# Patient Record
Sex: Male | Born: 1950 | Race: Black or African American | Hispanic: No | Marital: Single | Smoking: Current every day smoker
Health system: Southern US, Community
[De-identification: ages and names within clinical notes are randomized; demographics above are authoritative.]

## PROBLEM LIST (undated history)

## (undated) DIAGNOSIS — Z5900 Homelessness unspecified: Secondary | ICD-10-CM

## (undated) DIAGNOSIS — Z915 Personal history of self-harm: Secondary | ICD-10-CM

## (undated) DIAGNOSIS — Z9151 Personal history of suicidal behavior: Secondary | ICD-10-CM

## (undated) DIAGNOSIS — J449 Chronic obstructive pulmonary disease, unspecified: Secondary | ICD-10-CM

## (undated) DIAGNOSIS — I10 Essential (primary) hypertension: Secondary | ICD-10-CM

---

## 2014-04-09 ENCOUNTER — Other Ambulatory Visit (HOSPITAL_COMMUNITY): Payer: Self-pay | Admitting: Internal Medicine

## 2014-04-09 ENCOUNTER — Ambulatory Visit (HOSPITAL_COMMUNITY)
Admission: RE | Admit: 2014-04-09 | Discharge: 2014-04-09 | Disposition: A | Discharge status: Home / Self Care | Payer: Medicare Other | Source: Ambulatory Visit | Attending: Internal Medicine | Admitting: Internal Medicine

## 2014-04-09 DIAGNOSIS — R0602 Shortness of breath: Secondary | ICD-10-CM | POA: Insufficient documentation

## 2014-04-09 DIAGNOSIS — R0989 Other specified symptoms and signs involving the circulatory and respiratory systems: Secondary | ICD-10-CM | POA: Diagnosis not present

## 2015-03-01 ENCOUNTER — Encounter (HOSPITAL_COMMUNITY): Payer: Self-pay | Admitting: Family Medicine

## 2015-03-01 ENCOUNTER — Emergency Department (HOSPITAL_COMMUNITY)
Admission: EM | Admit: 2015-03-01 | Discharge: 2015-03-02 | Disposition: A | Discharge status: Other Acute Inpatient Hospital | Payer: Medicare Other | Attending: Emergency Medicine | Admitting: Emergency Medicine

## 2015-03-01 DIAGNOSIS — I10 Essential (primary) hypertension: Secondary | ICD-10-CM | POA: Insufficient documentation

## 2015-03-01 DIAGNOSIS — R443 Hallucinations, unspecified: Secondary | ICD-10-CM | POA: Insufficient documentation

## 2015-03-01 DIAGNOSIS — J029 Acute pharyngitis, unspecified: Secondary | ICD-10-CM | POA: Insufficient documentation

## 2015-03-01 DIAGNOSIS — Z720 Tobacco use: Secondary | ICD-10-CM | POA: Diagnosis not present

## 2015-03-01 DIAGNOSIS — R45851 Suicidal ideations: Secondary | ICD-10-CM | POA: Insufficient documentation

## 2015-03-01 DIAGNOSIS — F10239 Alcohol dependence with withdrawal, unspecified: Secondary | ICD-10-CM | POA: Diagnosis present

## 2015-03-01 HISTORY — DX: Essential (primary) hypertension: I10

## 2015-03-01 LAB — CBC
HEMATOCRIT: 39.8 % (ref 39.0–52.0)
Hemoglobin: 13.3 g/dL (ref 13.0–17.0)
MCH: 32.2 pg (ref 26.0–34.0)
MCHC: 33.4 g/dL (ref 30.0–36.0)
MCV: 96.4 fL (ref 78.0–100.0)
PLATELETS: 206 10*3/uL (ref 150–400)
RBC: 4.13 MIL/uL — ABNORMAL LOW (ref 4.22–5.81)
RDW: 13.8 % (ref 11.5–15.5)
WBC: 5.7 10*3/uL (ref 4.0–10.5)

## 2015-03-01 LAB — COMPREHENSIVE METABOLIC PANEL
ALBUMIN: 3.9 g/dL (ref 3.5–5.0)
ALK PHOS: 49 U/L (ref 38–126)
ALT: 16 U/L — AB (ref 17–63)
AST: 22 U/L (ref 15–41)
Anion gap: 7 (ref 5–15)
BILIRUBIN TOTAL: 0.3 mg/dL (ref 0.3–1.2)
BUN: 10 mg/dL (ref 6–20)
CO2: 25 mmol/L (ref 22–32)
CREATININE: 1.01 mg/dL (ref 0.61–1.24)
Calcium: 9.1 mg/dL (ref 8.9–10.3)
Chloride: 104 mmol/L (ref 101–111)
GFR calc Af Amer: >60 mL/min (ref >60)
GLUCOSE: 86 mg/dL (ref 65–99)
Potassium: 4.1 mmol/L (ref 3.5–5.1)
Sodium: 136 mmol/L (ref 135–145)
TOTAL PROTEIN: 7.1 g/dL (ref 6.5–8.1)

## 2015-03-01 LAB — RAPID URINE DRUG SCREEN, HOSP PERFORMED
AMPHETAMINES: NOT DETECTED
BARBITURATES: NOT DETECTED
BENZODIAZEPINES: NOT DETECTED
Cocaine: NOT DETECTED
Opiates: NOT DETECTED
Tetrahydrocannabinol: NOT DETECTED

## 2015-03-01 LAB — SALICYLATE LEVEL: Salicylate Lvl: <4 mg/dL (ref 2.8–30.0)

## 2015-03-01 LAB — ETHANOL

## 2015-03-01 LAB — ACETAMINOPHEN LEVEL: Acetaminophen (Tylenol), Serum: <10 ug/mL — ABNORMAL LOW (ref 10–30)

## 2015-03-01 MED ORDER — ALUM & MAG HYDROXIDE-SIMETH 200-200-20 MG/5ML PO SUSP: 30.0000 mL | ORAL | Status: DC | PRN

## 2015-03-01 MED ORDER — IBUPROFEN 400 MG PO TABS: 600.0000 mg | ORAL_TABLET | Freq: Three times a day (TID) | ORAL | Status: DC | PRN

## 2015-03-01 MED ORDER — THIAMINE HCL 100 MG/ML IJ SOLN: 100.0000 mg | Freq: Every day | INTRAMUSCULAR | Status: DC

## 2015-03-01 MED ORDER — ONDANSETRON HCL 4 MG PO TABS: 4.0000 mg | ORAL_TABLET | Freq: Three times a day (TID) | ORAL | Status: DC | PRN

## 2015-03-01 MED ORDER — LORAZEPAM 1 MG PO TABS: 1.0000 mg | ORAL_TABLET | Freq: Three times a day (TID) | ORAL | Status: DC | PRN

## 2015-03-01 MED ORDER — VITAMIN B-1 100 MG PO TABS
100.0000 mg | ORAL_TABLET | Freq: Every day | ORAL | Status: DC
  Administered 2015-03-01: 100 mg via ORAL
  Filled 2015-03-01: qty 1

## 2015-03-01 MED ORDER — ACETAMINOPHEN 325 MG PO TABS: 650.0000 mg | ORAL_TABLET | ORAL | Status: DC | PRN
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
## 2015-03-01 NOTE — ED Provider Notes (Signed)
CSN: [id number]     Arrival date & time 03/01/15  1521 History   First MD Initiated Contact with Patient 03/01/15 1645     Chief Complaint  Patient presents with  . Alcohol Problem  . Drug Problem     (Consider location/radiation/quality/duration/timing/severity/associated sxs/prior Treatment) HPI   Ryan Reid is a 64 year old male, current smoker with history of hypertension, alcohol abuse and polysubstance abuse, who presents to the ER today requesting detox.  He currently is suicidal and homicidal and hearing voices.  His plan for suicide is to "jump off of that Tower where I'm staying."  He plans to shoot the "drug guy," although he denies access to a gun, he does state that he has access to knives.  He hears voices frequently that tell him to walk in front of cars and jump off the tower.  He regularly drinks about 6 beers a day, he last drank a 40 this morning, "because it was there."  He last smoked crack this morning as well.  He complains of mild discomfort in his throat.  He denies headache, chest pain, shortness of breath, wheeze, cough, fever, abdominal pain, nausea, vomiting, diarrhea, palpitations, lower extremity edema.   Past Medical History  Diagnosis Date  . Hypertension    History reviewed. No pertinent past surgical history. History reviewed. No pertinent family history. Social History  Substance Use Topics  . Smoking status: Current Every Day Smoker  . Smokeless tobacco: None  . Alcohol Use: Yes     Comment: 1 beer a day    Review of Systems  Constitutional: Negative.   HENT: Positive for sore throat. Negative for congestion, ear discharge, ear pain, facial swelling, hearing loss, mouth sores, nosebleeds, postnasal drip, rhinorrhea, sinus pressure, trouble swallowing and voice change.   Eyes: Negative.   Respiratory: Negative.   Cardiovascular: Negative.   Gastrointestinal: Negative.   Genitourinary: Negative.   Musculoskeletal: Negative.   Skin:  Negative.   Neurological: Negative.   Psychiatric/Behavioral: Positive for suicidal ideas and hallucinations. Negative for self-injury.   Allergies  Lactose intolerance (gi)  Home Medications   Prior to Admission medications   Not on File   BP 130/73 mmHg  Pulse 61  Temp(Src) 98.3 F (36.8 C)  Resp 18  Wt 147 lb 9 oz (66.934 kg)  SpO2 100% Physical Exam  Constitutional: He is oriented to person, place, and time. He appears well-developed and well-nourished. No distress.  Well-appearing thin male, appears older than stated age  HENT:  Head: Normocephalic and atraumatic.  Nose: Nose normal.  Mouth/Throat: Oropharynx is clear and moist. No oropharyngeal exudate.  Soft palate, uvula and posterior oropharynx - some hyperpigmented small lesions, without edema, erythema, or exudate  Eyes: Conjunctivae and EOM are normal. Pupils are equal, round, and reactive to light. Right eye exhibits no discharge. Left eye exhibits no discharge. No scleral icterus.  Neck: Normal range of motion. No JVD present. No tracheal deviation present. No thyromegaly present.  Cardiovascular: Normal rate, regular rhythm, normal heart sounds and intact distal pulses.  Exam reveals no gallop and no friction rub.   No murmur heard. Pulmonary/Chest: Effort normal and breath sounds normal. No respiratory distress. He has no wheezes. He has no rales. He exhibits no tenderness.  Abdominal: Soft. Bowel sounds are normal. He exhibits no distension and no mass. There is no tenderness. There is no rebound and no guarding.  Musculoskeletal: Normal range of motion. He exhibits no edema or tenderness.  Lymphadenopathy:  He has no cervical adenopathy.  Neurological: He is alert and oriented to person, place, and time. He has normal reflexes. No cranial nerve deficit. He exhibits normal muscle tone. Coordination normal.  Skin: Skin is warm and dry. No rash noted. He is not diaphoretic. No erythema. No pallor.  Psychiatric:  He has a normal mood and affect. His behavior is normal. Judgment and thought content normal.  Nursing note and vitals reviewed.   ED Course  Procedures (including critical care time) Labs Review Labs Reviewed  COMPREHENSIVE METABOLIC PANEL - Abnormal; Notable for the following:    ALT 16 (*)    All other components within normal limits  ACETAMINOPHEN LEVEL - Abnormal; Notable for the following:    Acetaminophen (Tylenol), Serum <10 (*)    All other components within normal limits  CBC - Abnormal; Notable for the following:    RBC 4.13 (*)    All other components within normal limits  ETHANOL  SALICYLATE LEVEL  URINE RAPID DRUG SCREEN, HOSP PERFORMED    Imaging Review No results found. I, Danelle Berry, personally reviewed and evaluated these images and lab results as part of my medical decision-making.   EKG Interpretation None      MDM   Final diagnoses:  None    Pt with ETOH abuse and crack use, requesting detox.  +SI and +HI with a plan and +auditory hallucinations, -visual hallucinations  Med clearance work up initiated, pt has no other complaints Urine drug screen negative, CMP, CBC unremarkable, negative salicylate, acetaminophen and ETOH  TTS consult requested With medical clearance, pt transferred to Pod C, with ED Psych hold orders  Beryle Flock - advised that pt does meet Inpt psych admission once we obtain 2 additional sets of normal vitals and CIWA done.  Orders added.  Pt will be admitted to [facility], PA-C 03/01/15 8295  Arby Barrette, MD 03/03/15 [phone]

## 2015-03-01 NOTE — ED Notes (Signed)
Pt here for treatment to detox from alcohol and crack/cocaine. sts last drink this am and last drug use the first of the month.

## 2015-03-01 NOTE — ED Notes (Signed)
Pt states homeless for 15 days. If able to get transportation to [place] would have resources.

## 2015-03-01 NOTE — ED Notes (Signed)
Pt eating and drinking without any problems

## 2015-03-01 NOTE — ED Notes (Signed)
Pt belongings inventoried, see inventory sheet. Placed in locker and with security.

## 2015-03-01 NOTE — ED Notes (Signed)
Pt eating meal tray 

## 2015-03-01 NOTE — ED Notes (Signed)
telepsych at bedside.

## 2015-03-01 NOTE — ED Notes (Signed)
Meal given

## 2015-03-02 ENCOUNTER — Encounter (HOSPITAL_COMMUNITY): Payer: Self-pay

## 2015-03-02 ENCOUNTER — Inpatient Hospital Stay (HOSPITAL_COMMUNITY)
Admission: EM | Admit: 2015-03-02 | Discharge: 2015-03-09 | DRG: 897 | Disposition: A | Discharge status: Rehabilitation Facility | Payer: Medicare Other | Source: Intra-hospital | Attending: Psychiatry | Admitting: Psychiatry

## 2015-03-02 DIAGNOSIS — F419 Anxiety disorder, unspecified: Secondary | ICD-10-CM | POA: Diagnosis present

## 2015-03-02 DIAGNOSIS — F1424 Cocaine dependence with cocaine-induced mood disorder: Secondary | ICD-10-CM

## 2015-03-02 DIAGNOSIS — I10 Essential (primary) hypertension: Secondary | ICD-10-CM | POA: Diagnosis present

## 2015-03-02 DIAGNOSIS — R45851 Suicidal ideations: Secondary | ICD-10-CM | POA: Diagnosis present

## 2015-03-02 DIAGNOSIS — F1994 Other psychoactive substance use, unspecified with psychoactive substance-induced mood disorder: Secondary | ICD-10-CM | POA: Diagnosis present

## 2015-03-02 DIAGNOSIS — F10230 Alcohol dependence with withdrawal, uncomplicated: Secondary | ICD-10-CM

## 2015-03-02 DIAGNOSIS — F1020 Alcohol dependence, uncomplicated: Secondary | ICD-10-CM | POA: Diagnosis present

## 2015-03-02 DIAGNOSIS — F17200 Nicotine dependence, unspecified, uncomplicated: Secondary | ICD-10-CM | POA: Diagnosis present

## 2015-03-02 DIAGNOSIS — R45850 Homicidal ideations: Secondary | ICD-10-CM | POA: Diagnosis present

## 2015-03-02 DIAGNOSIS — F1024 Alcohol dependence with alcohol-induced mood disorder: Secondary | ICD-10-CM | POA: Diagnosis present

## 2015-03-02 DIAGNOSIS — G4700 Insomnia, unspecified: Secondary | ICD-10-CM | POA: Diagnosis present

## 2015-03-02 DIAGNOSIS — F1420 Cocaine dependence, uncomplicated: Secondary | ICD-10-CM | POA: Diagnosis present

## 2015-03-02 MED ORDER — ACETAMINOPHEN 325 MG PO TABS: 650.0000 mg | ORAL_TABLET | Freq: Four times a day (QID) | ORAL | Status: DC | PRN

## 2015-03-02 MED ORDER — MAGNESIUM HYDROXIDE 400 MG/5ML PO SUSP
30.0000 mL | Freq: Every day | ORAL | Status: DC | PRN
Start: 2015-03-02 — End: 2015-03-09

## 2015-03-02 MED ORDER — TRAZODONE HCL 50 MG PO TABS
50.0000 mg | ORAL_TABLET | Freq: Every evening | ORAL | Status: DC | PRN
  Administered 2015-03-03 – 2015-03-05 (×3): 50 mg via ORAL
  Filled 2015-03-02: qty 28
  Filled 2015-03-02: qty 1
  Filled 2015-03-02 (×4): qty 28
  Filled 2015-03-02 (×2): qty 1
  Filled 2015-03-02: qty 28
  Filled 2015-03-02: qty 1
  Filled 2015-03-02 (×2): qty 28

## 2015-03-02 MED ORDER — HYDROXYZINE HCL 25 MG PO TABS
25.0000 mg | ORAL_TABLET | Freq: Four times a day (QID) | ORAL | Status: DC | PRN
Start: 2015-03-02 — End: 2015-03-09
  Administered 2015-03-03 – 2015-03-04 (×2): 25 mg via ORAL
  Filled 2015-03-02 (×2): qty 42
  Filled 2015-03-02: qty 1
  Filled 2015-03-02: qty 42
  Filled 2015-03-02 (×2): qty 1

## 2015-03-02 MED ORDER — ALUM & MAG HYDROXIDE-SIMETH 200-200-20 MG/5ML PO SUSP: 30.0000 mL | ORAL | Status: DC | PRN

## 2015-03-02 MED ORDER — ENSURE ENLIVE PO LIQD
237.0000 mL | Freq: Two times a day (BID) | ORAL | Status: DC
  Administered 2015-03-02 – 2015-03-08 (×10): 237 mL via ORAL

## 2015-03-02 NOTE — Progress Notes (Signed)
Admission Note  A 64 y/o patient with flat affect came in complaining of severe depression, alcohol and cocaine use. He states, "I drink and use cocaine as much as I can get my hands on." Pt also c/o low self-esteem, isolation, lack of motivation for activities, irritability, insomnia and homelessness. Pt who is also unsteady on his feet verbalizes h/o stroke causing bilateral leg weakness. Pt denied having any home meds. Pt goals according to patient are working on his overall psychological problem and staying alive.  Pt continues to be pleasant and cooperative during assessment.   Support, encouragement, and safe environment provided.  15-minute safety checks initiated and continued.

## 2015-03-02 NOTE — Progress Notes (Signed)
NUTRITION ASSESSMENT  Pt identified as at risk on the Malnutrition Screen Tool  INTERVENTION: 1. Educated patient on the importance of nutrition and encouraged intake of food and beverages. 2. Discussed weight goals. 3. Supplements: will order Ensure Enlive BID, each supplement provides 350 kcal and 20 grams of protein  NUTRITION DIAGNOSIS: Unintentional weight loss related to sub-optimal intake as evidenced by pt report.   Goal: Pt to meet >/= 90% of their estimated nutrition needs.  Monitor:  PO intake  Assessment:  Pt seen for MST. Per notes, pt with severe depression. He uses alcohol and cocaine and has reported using "as much as I can get my hands on." Pt is homeless.  Unsure of weight changes PTA. Will order supplements to aid.  64 y.o. male  Height: Ht Readings from Last 1 Encounters:  03/02/15 5' 7.25" (1.708 m)    Weight: Wt Readings from Last 1 Encounters:  03/02/15 135 lb (61.236 kg)    Weight Hx: Wt Readings from Last 10 Encounters:  03/02/15 135 lb (61.236 kg)  03/01/15 147 lb 9 oz (66.934 kg)    BMI:  Body mass index is 20.99 kg/(m^2). Pt meets criteria for normal weight status based on current BMI.  Estimated Nutritional Needs: Kcal: 25-30 kcal/kg Protein: > 1 gram protein/kg Fluid: 1 ml/kcal  Diet Order: Diet regular Room service appropriate?: Yes; Fluid consistency:: Thin Pt is also offered choice of unit snacks mid-morning and mid-afternoon.  Pt is eating as desired.   Lab results and medications reviewed.      Trenton Gammon, RD, LDN Inpatient Clinical Dietitian Pager # [phone] After hours/weekend pager # [phone]

## 2015-03-02 NOTE — Tx Team (Addendum)
Initial Interdisciplinary Treatment Plan   PATIENT STRESSORS: Educational concerns Substance abuse   PATIENT STRENGTHS: Careers information officer for treatment/growth Physical Health   PROBLEM LIST: Problem List/Patient Goals Date to be addressed Date deferred Reason deferred Estimated date of resolution  "Work on myself (my problems in general" 03/02/15     "Staying Alive" 03/02/15     Depression 03/02/15     Substance Abuse 03/02/15     Potential for suicide 03/02/15                              DISCHARGE CRITERIA:  Improved stabilization in mood, thinking, and/or behavior Medical problems require only outpatient monitoring Verbal commitment to aftercare and medication compliance Withdrawal symptoms are absent or subacute and managed without 24-hour nursing intervention  PRELIMINARY DISCHARGE PLAN: Outpatient therapy  PATIENT/FAMIILY INVOLVEMENT: This treatment plan has been presented to and reviewed with the patient, Ryan Reid, and/or family member.  The patient and family have been given the opportunity to ask questions and make suggestions.  Eveline Keto 03/02/2015, 6:44 AM

## 2015-03-02 NOTE — Progress Notes (Signed)
Recreation Therapy Notes  Animal-Assisted Activity (AAA) Program Checklist/Progress Notes Patient Eligibility Criteria Checklist & Daily Group note for Rec Tx Intervention  Date: 08.16.2016 Time: 2:45pm Location: [address] Dayroom    AAA/T Program Assumption of Risk Form signed by Patient/ or Parent Legal Guardian yes  Patient is free of allergies or sever asthma yes  Patient reports no fear of animals yes  Patient reports no history of cruelty to animals yes  Patient understands his/her participation is voluntary yes  Behavioral Response: Did not attend.    [name] L [name], LRT/CTRS        Ryan Reid L 03/02/2015 3:15 PM 

## 2015-03-02 NOTE — Progress Notes (Signed)
[facility] Group Notes:  (Nursing/MHT/Case Management/Adjunct)  Date:  03/02/2015  Time: 2100 Type of Therapy:  wrap up group  Participation Level:  Active  Participation Quality:  Appropriate, Attentive, Sharing and Supportive  Affect:  Flat  Cognitive:  Alert  Insight:  Improving  Engagement in Group:  Engaged  Modes of Intervention:  Clarification, Education and Support  Summary of Progress/Problems: Pt reports wanting long term treatment. Pt shared that he has had multiple treatments but never more than 14 days.   Shelah Lewandowsky 03/02/2015, 10:00 PM
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
## 2015-03-02 NOTE — [facility] Suicide Risk Assessment (Signed)
[facility] INPATIENT:  Family/Significant Other Suicide Prevention Education  Suicide Prevention Education:  Patient Refusal for Family/Significant Other Suicide Prevention Education: The patient Ryan Reid has refused to provide written consent for family/significant other to be provided Family/Significant Other Suicide Prevention Education during admission and/or prior to discharge.  Physician notified.  SPE completed with pt, as pt refused to consent to family contact. SPI pamphlet provided to pt and pt was encouraged to share information with support network, ask questions, and talk about any concerns relating to SPE. Pt denies access to guns/firearms and verbalized understanding of information provided. Mobile Crisis information also provided to pt.   Smart, Daphine Loch LCSWA  03/02/2015, 3:53 PM

## 2015-03-02 NOTE — H&P (Signed)
Psychiatric Admission Assessment Adult  Patient Identification: Ryan Reid  MRN:  [id number]  Date of Evaluation:  03/02/2015  Chief Complaint:  Depressive Disorder  Principal Diagnosis: Substance induced mood disorder  Diagnosis:   Patient Active Problem List   Diagnosis Date Noted  . Substance induced mood disorder [F19.94] 03/02/2015   History of Present Illness: This is a 64 year old AA male. Admitted to [facility] from the [facility] with complaints of suicidal/homicidal ideations & auditory hallucination, seeking substance abuse treatments. Kahlil reports, "The Transportation Lucianne Lei took me to the hospital. I called them because my mind was wondering off. This has been going on for a while. I had this problem about one year ago. I also called the ambulance that took me to the [facility]. I was told then that I had depression & HTN. I don't remember the medicines I was given, but I stopped taking the Prozac because it was killing people. I have been using crack cocaine for 40 years. I stopped using about 1 year, then relapsed. I attempted to kill myself 1 year ago by trying to cut myself open. I drink as much alcohol as I can get any day. I was thinking about hurting the drug dealer that deals the cocaine I was using, but could not get to him. I'm still thinking about killing him".  Elements:  Location:  Alcohol & cocaine use disorder. Quality:  Excessive use of alcohol & Cocaine. Severity:  Moderate. Timing:  Acute. Duration:  Chronic. Context:  "I've used cocaine for 40 years, hard to stop".  Associated Signs/Symptoms:  Depression Symptoms:  depressed mood,  (Hypo) Manic Symptoms:  Denies any hymanic symptoms  Anxiety Symptoms:  Denies any anxiousness  Psychotic Symptoms:  Denies any hallucinations  PTSD Symptoms: NA  Total Time spent with patient: 1 hour  Past Medical History: Hypertension  Family History: History reviewed. No pertinent family  history.  Social History:  History  Alcohol Use  . Yes    Comment: 1 beer a day     History  Drug Use  . Yes  . Special: Cocaine    Social History   Social History  . Marital Status: Single    Spouse Name: N/A  . Number of Children: N/A  . Years of Education: N/A   Social History Main Topics  . Smoking status: Current Every Day Smoker  . Smokeless tobacco: None  . Alcohol Use: Yes     Comment: 1 beer a day  . Drug Use: Yes    Special: Cocaine  . Sexual Activity: Not Asked   Other Topics Concern  . None   Social History Narrative   Additional Social History:    Pain Medications: Denies Prescriptions: none History of alcohol / drug use?: Yes Longest period of sobriety (when/how long): 9 months Withdrawal Symptoms: Agitation Name of Substance 1: Alcohol 1 - Age of First Use: 12 1 - Amount (size/oz): 4 beers 1 - Frequency: daily 1 - Duration: "for years" 1 - Last Use / Amount: today Name of Substance 2: Crack Cocaine 2 - Age of First Use: 27 2 - Amount (size/oz): "as much as I can get" 2 - Frequency: 1 x every two weeks 2 - Duration: "for years" 2 - Last Use / Amount: 1st of the month  Musculoskeletal: Strength & Muscle Tone: within normal limits Gait & Station: normal Patient leans: N/A  Psychiatric Specialty Exam: Physical Exam  Constitutional: He is oriented to person, place, and  time. He appears well-developed and well-nourished.  HENT:  Head: Normocephalic.  Eyes: Pupils are equal, round, and reactive to light.  Neck: Normal range of motion.  Cardiovascular: Normal rate.   Respiratory: Effort normal.  GI: Soft.  Genitourinary:  Denies any issues in this area   Musculoskeletal: Normal range of motion.  Neurological: He is alert and oriented to person, place, and time.  Skin: Skin is dry.  Psychiatric: His speech is normal and behavior is normal. Thought content normal. His affect is not angry, not blunt, not labile and not inappropriate.  Cognition and memory are normal. He expresses impulsivity. He exhibits a depressed mood.    Review of Systems  Constitutional: Positive for malaise/fatigue.  HENT: Negative.   Eyes: Negative.   Respiratory: Negative.   Cardiovascular: Negative.   Gastrointestinal: Negative.   Genitourinary: Negative.   Musculoskeletal: Negative.   Skin: Negative.   Neurological: Positive for weakness.  Psychiatric/Behavioral: Positive for depression and substance abuse (Alcoholism, cocaine abuse). Negative for suicidal ideas, hallucinations and memory loss. The patient has insomnia.     Blood pressure 127/88, pulse 65, temperature 98 F (36.7 C), temperature source Oral, resp. rate 20, height 5' 7.25" (1.708 m), weight 61.236 kg (135 lb), SpO2 100 %.Body mass index is 20.99 kg/(m^2).   General Appearance: Casual  Eye Contact:: Good  Speech: Clear and Coherent and Normal Rate  Volume: Normal  Mood: Depressed  Affect: Flat  Thought Process: Coherent  Orientation: Full (Time, Place, and Person)  Thought Content: Denies any hallucination  Suicidal Thoughts: No  Homicidal Thoughts: No  Memory: Immediate;   Good Recent;   Good Remote;   Fair  Judgement: Fair  Insight: Present  Psychomotor Activity: Normal  Concentration: Good  Recall: Good  Fund of Knowledge:Fair  Language: Good  Akathisia: No  Handed: Right  AIMS (if indicated):    Assets: Desire for Improvement  ADL's: Intact  Cognition: WNL  Sleep:         Risk to Self: Is patient at risk for suicide?: No Risk to Others: No Prior Inpatient Therapy: Yes Prior Outpatient Therapy: Yes  Alcohol Screening: 1. How often do you have a drink containing alcohol?: 4 or more times a week 2. How many drinks containing alcohol do you have on a typical day when you are drinking?: 3 or 4 3. How often do you have six or more drinks on one occasion?: Monthly Preliminary Score: 3 4. How often during  the last year have you found that you were not able to stop drinking once you had started?: Daily or almost daily 5. How often during the last year have you failed to do what was normally expected from you becasue of drinking?: Weekly 6. How often during the last year have you needed a first drink in the morning to get yourself going after a heavy drinking session?: Weekly 7. How often during the last year have you had a feeling of guilt of remorse after drinking?: Daily or almost daily 8. How often during the last year have you been unable to remember what happened the night before because you had been drinking?: Daily or almost daily 9. Have you or someone else been injured as a result of your drinking?: No 10. Has a relative or friend or a doctor or another health worker been concerned about your drinking or suggested you cut down?: Yes, during the last year Alcohol Use Disorder Identification Test Final Score (AUDIT): 29 Brief Intervention: Yes  Allergies:  Allergies  Allergen Reactions  . Lactose Intolerance (Gi) Nausea And Vomiting   Lab Results:  Results for orders placed or performed during the hospital encounter of 03/01/15 (from the past 48 hour(s))  Comprehensive metabolic panel     Status: Abnormal   Collection Time: 03/01/15  3:51 PM  Result Value Ref Range   Sodium 136 135 - 145 mmol/L   Potassium 4.1 3.5 - 5.1 mmol/L   Chloride 104 101 - 111 mmol/L   CO2 25 22 - 32 mmol/L   Glucose, Bld 86 65 - 99 mg/dL   BUN 10 6 - 20 mg/dL   Creatinine, Ser 1.01 0.61 - 1.24 mg/dL   Calcium 9.1 8.9 - 10.3 mg/dL   Total Protein 7.1 6.5 - 8.1 g/dL   Albumin 3.9 3.5 - 5.0 g/dL   AST 22 15 - 41 U/L   ALT 16 (L) 17 - 63 U/L   Alkaline Phosphatase 49 38 - 126 U/L   Total Bilirubin 0.3 0.3 - 1.2 mg/dL   GFR calc non Af Amer >60 >60 mL/min   GFR calc Af Amer >60 >60 mL/min    Comment: (NOTE) The eGFR has been calculated using the CKD EPI equation. This calculation has not been validated  in all clinical situations. eGFR's persistently <60 mL/min signify possible Chronic Kidney Disease.    Anion gap 7 5 - 15  Ethanol (ETOH)     Status: None   Collection Time: 03/01/15  3:51 PM  Result Value Ref Range   Alcohol, Ethyl (B) <5 <5 mg/dL    Comment:        LOWEST DETECTABLE LIMIT FOR SERUM ALCOHOL IS 5 mg/dL FOR MEDICAL PURPOSES ONLY   Salicylate level     Status: None   Collection Time: 03/01/15  3:51 PM  Result Value Ref Range   Salicylate Lvl <4.3 2.8 - 30.0 mg/dL  Acetaminophen level     Status: Abnormal   Collection Time: 03/01/15  3:51 PM  Result Value Ref Range   Acetaminophen (Tylenol), Serum <10 (L) 10 - 30 ug/mL    Comment:        THERAPEUTIC CONCENTRATIONS VARY SIGNIFICANTLY. A RANGE OF 10-30 ug/mL MAY BE AN EFFECTIVE CONCENTRATION FOR MANY PATIENTS. HOWEVER, SOME ARE BEST TREATED AT CONCENTRATIONS OUTSIDE THIS RANGE. ACETAMINOPHEN CONCENTRATIONS >150 ug/mL AT 4 HOURS AFTER INGESTION AND >50 ug/mL AT 12 HOURS AFTER INGESTION ARE OFTEN ASSOCIATED WITH TOXIC REACTIONS.   CBC     Status: Abnormal   Collection Time: 03/01/15  3:51 PM  Result Value Ref Range   WBC 5.7 4.0 - 10.5 K/uL   RBC 4.13 (L) 4.22 - 5.81 MIL/uL   Hemoglobin 13.3 13.0 - 17.0 g/dL   HCT 39.8 39.0 - 52.0 %   MCV 96.4 78.0 - 100.0 fL   MCH 32.2 26.0 - 34.0 pg   MCHC 33.4 30.0 - 36.0 g/dL   RDW 13.8 11.5 - 15.5 %   Platelets 206 150 - 400 K/uL  Urine rapid drug screen (hosp performed) (Not at [facility])     Status: None   Collection Time: 03/01/15  5:05 PM  Result Value Ref Range   Opiates NONE DETECTED NONE DETECTED   Cocaine NONE DETECTED NONE DETECTED   Benzodiazepines NONE DETECTED NONE DETECTED   Amphetamines NONE DETECTED NONE DETECTED   Tetrahydrocannabinol NONE DETECTED NONE DETECTED   Barbiturates NONE DETECTED NONE DETECTED    Comment:        DRUG SCREEN FOR MEDICAL PURPOSES  ONLY.  IF CONFIRMATION IS NEEDED FOR ANY PURPOSE, NOTIFY LAB WITHIN 5 DAYS.        LOWEST  DETECTABLE LIMITS FOR URINE DRUG SCREEN Drug Class       Cutoff (ng/mL) Amphetamine      1000 Barbiturate      200 Benzodiazepine   193 Tricyclics       790 Opiates          300 Cocaine          300 THC              50    Current Medications: Current Facility-Administered Medications  Medication Dose Route Frequency Provider Last Rate Last Dose  . acetaminophen (TYLENOL) tablet 650 mg  650 mg Oral Q6H PRN Laverle Hobby, PA-C      . alum & mag hydroxide-simeth (MAALOX/MYLANTA) 200-200-20 MG/5ML suspension 30 mL  30 mL Oral Q4H PRN Laverle Hobby, PA-C      . feeding supplement (ENSURE ENLIVE) (ENSURE ENLIVE) liquid 237 mL  237 mL Oral BID BM Nicholaus Bloom, MD   237 mL at 03/02/15 0759  . hydrOXYzine (ATARAX/VISTARIL) tablet 25 mg  25 mg Oral Q6H PRN Laverle Hobby, PA-C      . magnesium hydroxide (MILK OF MAGNESIA) suspension 30 mL  30 mL Oral Daily PRN Laverle Hobby, PA-C      . traZODone (DESYREL) tablet 50 mg  50 mg Oral QHS,MR X 1 Spencer E Simon, PA-C       PTA Medications: No prescriptions prior to admission   Previous Psychotropic Medications: Yes   Substance Abuse History in the last 12 months:  Yes.     Consequences of Substance Abuse: Medical Consequences:  Liver damage, Possible death by overdose Legal Consequences:  Arrests, jail time, Loss of driving privilege. Family Consequences:  Family discord, divorce and or separation.  Results for orders placed or performed during the hospital encounter of 03/01/15 (from the past 72 hour(s))  Comprehensive metabolic panel     Status: Abnormal   Collection Time: 03/01/15  3:51 PM  Result Value Ref Range   Sodium 136 135 - 145 mmol/L   Potassium 4.1 3.5 - 5.1 mmol/L   Chloride 104 101 - 111 mmol/L   CO2 25 22 - 32 mmol/L   Glucose, Bld 86 65 - 99 mg/dL   BUN 10 6 - 20 mg/dL   Creatinine, Ser 1.01 0.61 - 1.24 mg/dL   Calcium 9.1 8.9 - 10.3 mg/dL   Total Protein 7.1 6.5 - 8.1 g/dL   Albumin 3.9 3.5 - 5.0 g/dL   AST  22 15 - 41 U/L   ALT 16 (L) 17 - 63 U/L   Alkaline Phosphatase 49 38 - 126 U/L   Total Bilirubin 0.3 0.3 - 1.2 mg/dL   GFR calc non Af Amer >60 >60 mL/min   GFR calc Af Amer >60 >60 mL/min    Comment: (NOTE) The eGFR has been calculated using the CKD EPI equation. This calculation has not been validated in all clinical situations. eGFR's persistently <60 mL/min signify possible Chronic Kidney Disease.    Anion gap 7 5 - 15  Ethanol (ETOH)     Status: None   Collection Time: 03/01/15  3:51 PM  Result Value Ref Range   Alcohol, Ethyl (B) <5 <5 mg/dL    Comment:        LOWEST DETECTABLE LIMIT FOR SERUM ALCOHOL IS 5 mg/dL FOR MEDICAL PURPOSES  ONLY   Salicylate level     Status: None   Collection Time: 03/01/15  3:51 PM  Result Value Ref Range   Salicylate Lvl <1.6 2.8 - 30.0 mg/dL  Acetaminophen level     Status: Abnormal   Collection Time: 03/01/15  3:51 PM  Result Value Ref Range   Acetaminophen (Tylenol), Serum <10 (L) 10 - 30 ug/mL    Comment:        THERAPEUTIC CONCENTRATIONS VARY SIGNIFICANTLY. A RANGE OF 10-30 ug/mL MAY BE AN EFFECTIVE CONCENTRATION FOR MANY PATIENTS. HOWEVER, SOME ARE BEST TREATED AT CONCENTRATIONS OUTSIDE THIS RANGE. ACETAMINOPHEN CONCENTRATIONS >150 ug/mL AT 4 HOURS AFTER INGESTION AND >50 ug/mL AT 12 HOURS AFTER INGESTION ARE OFTEN ASSOCIATED WITH TOXIC REACTIONS.   CBC     Status: Abnormal   Collection Time: 03/01/15  3:51 PM  Result Value Ref Range   WBC 5.7 4.0 - 10.5 K/uL   RBC 4.13 (L) 4.22 - 5.81 MIL/uL   Hemoglobin 13.3 13.0 - 17.0 g/dL   HCT 39.8 39.0 - 52.0 %   MCV 96.4 78.0 - 100.0 fL   MCH 32.2 26.0 - 34.0 pg   MCHC 33.4 30.0 - 36.0 g/dL   RDW 13.8 11.5 - 15.5 %   Platelets 206 150 - 400 K/uL  Urine rapid drug screen (hosp performed) (Not at [facility])     Status: None   Collection Time: 03/01/15  5:05 PM  Result Value Ref Range   Opiates NONE DETECTED NONE DETECTED   Cocaine NONE DETECTED NONE DETECTED   Benzodiazepines NONE  DETECTED NONE DETECTED   Amphetamines NONE DETECTED NONE DETECTED   Tetrahydrocannabinol NONE DETECTED NONE DETECTED   Barbiturates NONE DETECTED NONE DETECTED    Comment:        DRUG SCREEN FOR MEDICAL PURPOSES ONLY.  IF CONFIRMATION IS NEEDED FOR ANY PURPOSE, NOTIFY LAB WITHIN 5 DAYS.        LOWEST DETECTABLE LIMITS FOR URINE DRUG SCREEN Drug Class       Cutoff (ng/mL) Amphetamine      1000 Barbiturate      200 Benzodiazepine   109 Tricyclics       604 Opiates          300 Cocaine          300 THC              50     Observation Level/Precautions:  15 minute checks  Laboratory:  Per ED  Psychotherapy: Group sessions   Medications: Trazodone 50 mg, Hydroxyzine 25 mg  Consultations: As needed   Discharge Concerns: Maintaining sobriety, mood stability    Estimated LOS: 2-4 days  Other:     Psychological Evaluations: Yes   Treatment Plan Summary: Daily contact with patient to assess and evaluate symptoms and progress in treatment and Medication management: 1. Admit for crisis management and stabilization, estimated length of stay 3-5 days.  2. Medication management to reduce current symptoms to base line and improve the patient's overall level of functioning, continue Trazodone 50 mg for insomnia, Hydroxyzine 25 mg prn for anxiety. 3. Treat health problems as indicated.  4. Develop treatment plan to decrease risk of relapse upon discharge and the need for readmission.  5. Psycho-social education regarding relapse prevention and self care.  6. Health care follow up as needed for medical problems.  7. Review, reconcile, and reinstate any pertinent home medications for other health issues where appropriate. 8. Call for consults with hospitalist for any  additional specialty patient care services as needed.  Medical Decision Making:  New problem, with additional work up planned, Review of Psycho-Social Stressors (1), Review or order clinical lab tests (1), Discuss test with  performing physician (1), Review and summation of old records (2), Review of Medication Regimen & Side Effects (2) and Review of New Medication or Change in Dosage (2)  I certify that inpatient services furnished can reasonably be expected to improve the patient's condition.   Encarnacion Slates, PMHNP, FNP-BC 8/16/201610:58 AM I personally assessed the patient, reviewed the physical exam and labs and formulated the treatment plan Geralyn Flash A. Sabra Heck, M.D.

## 2015-03-02 NOTE — [facility] Group Notes (Signed)
[facility] Group Notes:  (Nursing/MHT/Case Management/Adjunct)  Date:  03/02/2015  Time:  0900 am  Type of Therapy:  Psychoeducational Skills  Participation Level:  Did Not Attend   Patient invited to group; declined to attend.  [place] Mon 03/02/2015, 9:40 AM

## 2015-03-02 NOTE — [facility] Group Notes (Signed)
[facility] LCSW Group Therapy  03/02/2015 11:41 AM  Type of Therapy:  Group Therapy  Participation Level:  Active  Participation Quality:  Attentive  Affect:  Appropriate  Cognitive:  Alert  Insight:  Improving  Engagement in Therapy:  Improving  Modes of Intervention:  Discussion, Education, Exploration, Problem-solving, Rapport Building, Socialization and Support  Summary of Progress/Problems: MHA Speaker came to talk about his personal journey with substance abuse and addiction. The pt processed ways by which to relate to the speaker. MHA speaker provided handouts and educational information pertaining to groups and services offered by the [facility].   Smart, Angella Montas LCSWA 03/02/2015, 11:41 AM

## 2015-03-02 NOTE — Progress Notes (Signed)
D: Patient has been isolative to room today.  Patient is here for alcohol and cocaine abuse.  Patient reports passive SI.  He contracts for safety.  He denies HI/AVH.  Patient has minimal interaction with staff and peers. A: Continue to monitor medication management and MD orders.  Safety checks completed every 15 minutes per protocol.  Offer support and encouragement as needed. R: Patient's behavior is appropriate to situation.

## 2015-03-02 NOTE — Progress Notes (Signed)
Patient ID: Ryan Reid, male   DOB: Nov 13, 1950, 64 y.o.   MRN: [id number] PER STATE REGULATIONS 482.30  THIS CHART WAS REVIEWED FOR MEDICAL NECESSITY WITH RESPECT TO THE PATIENT'S ADMISSION/DURATION OF STAY.  NEXT REVIEW DATE: 03/06/15  Loura Halt, RN, BSN CASE MANAGER
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
## 2015-03-03 MED ORDER — TRAZODONE HCL 50 MG PO TABS: 50.0000 mg | ORAL_TABLET | Freq: Every evening | ORAL | Status: DC | PRN

## 2015-03-03 MED ORDER — HYDROXYZINE HCL 25 MG PO TABS: 25.0000 mg | ORAL_TABLET | Freq: Four times a day (QID) | ORAL | Status: DC | PRN

## 2015-03-03 NOTE — Progress Notes (Signed)
  [facility] Adult Case Management Discharge Plan :  Will you be returning to the same living situation after discharge:  Yes,  with friend/family member At discharge, do you have transportation home?: Yes,  bus pass in chart per pt request. Do you have the ability to pay for your medications: Yes, medicare  Release of information consent forms completed and submitted to medical records by CSW.  Patient to Follow up at: Follow-up Information    Follow up with [facility].   Why:  Walk in between 8am-9am Monday through Friday for hospital follow-up/medication management/assessment for mental health services.    Contact information:   [address], [place] [address] Phone: [phone] Fax: [phone]      Patient denies SI/HI: Yes,  during group/self report.     Safety Planning and Suicide Prevention discussed: Yes,  SPE completed with pt. SPI pamphlet provided to pt and he was encouraged to share information with support network, ask questions, and talk about any concerns relating to SPE.  Have you used any form of tobacco in the last 30 days? (Cigarettes, Smokeless Tobacco, Cigars, and/or Pipes): Yes  Has patient been referred to the Quitline?: Patient refused referral  Smart, Lebron Quam  03/03/2015, 11:30 AM

## 2015-03-03 NOTE — Progress Notes (Signed)
[facility] MD Progress Note  03/03/2015 4:15 PM Ryan Reid  MRN:  [id number] Subjective:  Ryan Reid continues to endorse that he is trying to get his life back together. He states he has no support and his family is in [place]. He states he cant go back to where he was as he knows he will go back to the same behaviors and he states he cant afford to relapse, afraid he is going to die Principal Problem: Substance induced mood disorder Diagnosis:   Patient Active Problem List   Diagnosis Date Noted  . Substance induced mood disorder [F19.94] 03/02/2015  . Alcohol dependence [F10.20] 03/02/2015  . Cocaine dependence [F14.20] 03/02/2015   Total Time spent with patient: 30 minutes   Past Medical History: History reviewed. No pertinent past medical history. History reviewed. No pertinent past surgical history. Family History: History reviewed. No pertinent family history. Social History:  History  Alcohol Use  . Yes    Comment: 1 beer Reid day     History  Drug Use  . Yes  . Special: Cocaine    Social History   Social History  . Marital Status: Single    Spouse Name: N/Reid  . Number of Children: N/Reid  . Years of Education: N/Reid   Social History Main Topics  . Smoking status: Current Every Day Smoker  . Smokeless tobacco: None  . Alcohol Use: Yes     Comment: 1 beer Reid day  . Drug Use: Yes    Special: Cocaine  . Sexual Activity: Not Asked   Other Topics Concern  . None   Social History Narrative   Additional History:    Sleep: Fair  Appetite:  Poor   Assessment:   Musculoskeletal: Strength & Muscle Tone: within normal limits Gait & Station: normal Patient leans: normal   Psychiatric Specialty Exam: Physical Exam  Review of Systems  Constitutional: Positive for malaise/fatigue.  HENT: Negative.   Eyes: Negative.   Respiratory: Negative.   Cardiovascular: Negative.   Gastrointestinal: Negative.   Genitourinary: Negative.   Musculoskeletal: Negative.   Skin:  Negative.   Neurological: Positive for weakness.  Endo/Heme/Allergies: Negative.   Psychiatric/Behavioral: Positive for depression and substance abuse. The patient is nervous/anxious.     Blood pressure 130/76, pulse 70, temperature 98 F (36.7 C), temperature source Oral, resp. rate 16, height 5' 7.25" (1.708 m), weight 61.236 kg (135 lb), SpO2 100 %.Body mass index is 20.99 kg/(m^2).  General Appearance: Fairly Groomed  Patent attorney::  Fair  Speech:  Clear and Coherent and Slow  Volume:  Decreased  Mood:  Anxious and Depressed  Affect:  Restricted  Thought Process:  Coherent and Goal Directed  Orientation:  Full (Time, Place, and Person)  Thought Content:  symptoms events worries concerns  Suicidal Thoughts:  No  Homicidal Thoughts:  No  Memory:  Immediate;   Fair Recent;   Fair Remote;   Fair  Judgement:  Fair  Insight:  Present and Shallow  Psychomotor Activity:  Restlessness  Concentration:  Fair  Recall:  [employer] of Knowledge:Fair  Language: Fair  Akathisia:  No  Handed:  Right  AIMS (if indicated):     Assets:  Desire for Improvement  ADL's:  Intact  Cognition: WNL  Sleep:  Number of Hours: 5     Current Medications: Current Facility-Administered Medications  Medication Dose Route Frequency Provider Last Rate Last Dose  . acetaminophen (TYLENOL) tablet 650 mg  650 mg Oral Q6H PRN Kerry Hough,  PA-C      . alum & mag hydroxide-simeth (MAALOX/MYLANTA) 200-200-20 MG/5ML suspension 30 mL  30 mL Oral Q4H PRN Kerry Hough, PA-C      . feeding supplement (ENSURE ENLIVE) (ENSURE ENLIVE) liquid 237 mL  237 mL Oral BID BM Rachael Fee, MD   237 mL at 03/03/15 0809  . hydrOXYzine (ATARAX/VISTARIL) tablet 25 mg  25 mg Oral Q6H PRN Kerry Hough, PA-C      . magnesium hydroxide (MILK OF MAGNESIA) suspension 30 mL  30 mL Oral Daily PRN Kerry Hough, PA-C      . traZODone (DESYREL) tablet 50 mg  50 mg Oral QHS,MR X 1 Spencer E Simon, PA-C   50 mg at 03/02/15 2200     Lab Results:  Results for orders placed or performed during the hospital encounter of 03/01/15 (from the past 48 hour(s))  Urine rapid drug screen (hosp performed) (Not at [facility])     Status: None   Collection Time: 03/01/15  5:05 PM  Result Value Ref Range   Opiates NONE DETECTED NONE DETECTED   Cocaine NONE DETECTED NONE DETECTED   Benzodiazepines NONE DETECTED NONE DETECTED   Amphetamines NONE DETECTED NONE DETECTED   Tetrahydrocannabinol NONE DETECTED NONE DETECTED   Barbiturates NONE DETECTED NONE DETECTED    Comment:        DRUG SCREEN FOR MEDICAL PURPOSES ONLY.  IF CONFIRMATION IS NEEDED FOR ANY PURPOSE, NOTIFY LAB WITHIN 5 DAYS.        LOWEST DETECTABLE LIMITS FOR URINE DRUG SCREEN Drug Class       Cutoff (ng/mL) Amphetamine      1000 Barbiturate      200 Benzodiazepine   200 Tricyclics       300 Opiates          300 Cocaine          300 THC              50     Physical Findings: AIMS: Facial and Oral Movements Muscles of Facial Expression: None, normal Lips and Perioral Area: None, normal Jaw: Minimal Tongue: None, normal,Extremity Movements Upper (arms, wrists, hands, fingers): None, normal Lower (legs, knees, ankles, toes): None, normal, Trunk Movements Neck, shoulders, hips: None, normal, Overall Severity Severity of abnormal movements (highest score from questions above): None, normal Incapacitation due to abnormal movements: None, normal Patient's awareness of abnormal movements (rate only patient's report): No Awareness, Dental Status Current problems with teeth and/or dentures?: Yes Does patient usually wear dentures?: No  CIWA:  CIWA-Ar Total: 4 COWS:  COWS Total Score: 5  Treatment Plan Summary: Daily contact with patient to assess and evaluate symptoms and progress in treatment and Medication management Supportive approach/coping skills Alcohol cocaine dependence; work Reid relapse prevention plan Mood disorder; will reassess for the need of  psychotropic agents Use CBT/mindfulness Explore placement options  Medical Decision Making:  Review of Psycho-Social Stressors (1), Review or order clinical lab tests (1) and Review of Medication Regimen & Side Effects (2)     Ryan Reid 03/03/2015, 4:15 PM

## 2015-03-03 NOTE — Plan of Care (Signed)
Problem: Alteration in mood & ability to function due to Goal: LTG-Pt reports reduction in suicidal thoughts (Patient reports reduction in suicidal thoughts and is able to verbalize a safety plan for whenever patient is feeling suicidal)  Outcome: Progressing Patient denies SI.  Patient verbally contracts for safety.     

## 2015-03-03 NOTE — BHH Group Notes (Signed)
[facility] LCSW Aftercare Discharge Planning Group Note   03/03/2015 11:26 AM  Participation Quality:  Minimal   Mood/Affect:  Appropriate  Depression Rating:  0  Anxiety Rating:  0  Thoughts of Suicide:  No Will you contract for safety?   NA  Current AVH:  No  Plan for Discharge/Comments:  Pt reports that he is no longer interested in i/p treatment and wants to be d/ced. Pt refused to answer where he will live and asked for bus pass. [facility] for o/p care-pt refused all other referrals. Reports no withdrawal Sx.   Transportation Means: bus pass in chart.   Supports: none identified by pt.   Smart, [employer]

## 2015-03-03 NOTE — Progress Notes (Signed)
Recreation Therapy Notes  Date: 08.17.2016 Time: 9:30am Location: [address] Group room   Group Topic: Stress Management  Goal Area(s) Addresses:  Patient will actively participate in stress management techniques presented during session.   Behavioral Response: Did not attend.   Haelee Bolen L Braylei Totino, LRT/CTRS        Andersen Mckiver L 03/03/2015 3:02 PM 

## 2015-03-03 NOTE — Progress Notes (Signed)
D: Patient originally wanted to be discharged today.  He spoke with provider and decided he wanted to stay.  He rates his depression and hopelessness as a 9; anxiety as a 9.  He denies any specific withdrawal symptoms.  He denies SI/HI/AVH.  His goal is "being happy; looking for long term treatment."  Patient states he is sleeping and eating well. A: Continue to monitor medication management and MD orders.  Safety checks completed every 15 minutes per protocol.  Offer support and encouragement as needed. R: Patient's behavior is appropriate to situation.

## 2015-03-03 NOTE — Progress Notes (Signed)
Pt did not attend NA group this evening.  

## 2015-03-03 NOTE — Progress Notes (Signed)
D: Patient in the dayroom on approach.  Patient states he had a good day.  Patient states, "I am happy to be alive."  Patient states he did not have a goal for today.  Patient states he did not sleep well last night and is requesting sleep medication tonight.  Patient denies SI/HI and denies AVH.   A: Staff to monitor Q 15 mins for safety.  Encouragement and support offered.  Scheduled medications administered per orders. R: Patient remains safe on the unit.  Patient did not attend group tonight.  Patient visible on the unit before group and after group.  Patient taking administered medications.
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
## 2015-03-04 MED ORDER — NICOTINE 21 MG/24HR TD PT24
21.0000 mg | MEDICATED_PATCH | Freq: Every day | TRANSDERMAL | Status: DC
Start: 2015-03-04 — End: 2015-03-09
  Administered 2015-03-06: 21 mg via TRANSDERMAL
  Filled 2015-03-04 (×7): qty 1

## 2015-03-04 NOTE — Progress Notes (Signed)
[facility] MD Progress Note  03/04/2015 3:35 PM Ryan Reid  MRN:  [id number] Subjective:  Ryan Reid endorses that he needs help. He states he really needs to go to rehab. Feels that his life is out of control. He really wants things to be better and has not given up yet. Worried that if he does not do something now he could die as things are getting worst every time he relapses. He states he is struggling with some depression but thinks it has to do with his using Principal Problem: Substance induced mood disorder Diagnosis:   Patient Active Problem List   Diagnosis Date Noted  . Substance induced mood disorder [F19.94] 03/02/2015  . Alcohol dependence [F10.20] 03/02/2015  . Cocaine dependence [F14.20] 03/02/2015   Total Time spent with patient: 20 minutes   Past Medical History: History reviewed. No pertinent past medical history. History reviewed. No pertinent past surgical history. Family History: History reviewed. No pertinent family history. Social History:  History  Alcohol Use  . Yes    Comment: 1 beer a day     History  Drug Use  . Yes  . Special: Cocaine    Social History   Social History  . Marital Status: Single    Spouse Name: N/A  . Number of Children: N/A  . Years of Education: N/A   Social History Main Topics  . Smoking status: Current Every Day Smoker  . Smokeless tobacco: None  . Alcohol Use: Yes     Comment: 1 beer a day  . Drug Use: Yes    Special: Cocaine  . Sexual Activity: Not Asked   Other Topics Concern  . None   Social History Narrative   Additional History:    Sleep: Fair  Appetite:  Fair   Assessment:   Musculoskeletal: Strength & Muscle Tone: within normal limits Gait & Station: normal Patient leans: normal   Psychiatric Specialty Exam: Physical Exam  Review of Systems  Constitutional: Positive for malaise/fatigue.  HENT: Negative.   Eyes: Negative.   Respiratory: Negative.   Cardiovascular: Negative.    Gastrointestinal: Negative.   Genitourinary: Negative.   Musculoskeletal: Negative.   Skin: Negative.   Neurological: Positive for weakness.  Endo/Heme/Allergies: Negative.   Psychiatric/Behavioral: Positive for depression and substance abuse. The patient is nervous/anxious.     Blood pressure 104/85, pulse 77, temperature 99.3 F (37.4 C), temperature source Oral, resp. rate 16, height 5' 7.25" (1.708 m), weight 61.236 kg (135 lb), SpO2 100 %.Body mass index is 20.99 kg/(m^2).  General Appearance: Fairly Groomed  Patent attorney::  Fair  Speech:  Clear and Coherent  Volume:  Decreased  Mood:  Anxious, Depressed and worried  Affect:  Restricted  Thought Process:  Coherent and Goal Directed  Orientation:  Full (Time, Place, and Person)  Thought Content:  symptoms events worries concerns  Suicidal Thoughts:  No  Homicidal Thoughts:  No  Memory:  Immediate;   Fair Recent;   Fair Remote;   Fair  Judgement:  Fair  Insight:  Shallow  Psychomotor Activity:  Restlessness  Concentration:  Fair  Recall:  [employer] of Knowledge:Fair  Language: Fair  Akathisia:  No  Handed:  Right  AIMS (if indicated):     Assets:  Desire for Improvement  ADL's:  Intact  Cognition: WNL  Sleep:  Number of Hours: 5     Current Medications: Current Facility-Administered Medications  Medication Dose Route Frequency Provider Last Rate Last Dose  . acetaminophen (TYLENOL) tablet 650  mg  65Kerry HoughH PRN Spencer E Simon, PA-C      . alum & mag hydroxide-simeth (MAALOX/MYLANTA) 200-200-20 MG/5ML suspension 30 mL  30 mL Oral Q4H PRN Kerry Hough, PA-C      . feeding supplement (ENSURE ENLIVE) (ENSURE ENLIVE) liquid 237 mL  237 mL Oral BID BM Rachael Fee, MD   237 mL at 03/04/15 1511  . hydrOXYzine (ATARAX/VISTARIL) tablet 25 mg  25 mg Oral Q6H PRN Kerry Hough, PA-C   25 mg at 03/03/15 2058  . magnesium hydroxide (MILK OF MAGNESIA) suspension 30 mL  30 mL Oral Daily PRN Kerry Hough, PA-C       . nicotine (NICODERM CQ - dosed in mg/24 hours) patch 21 mg  21 mg Transdermal Daily Rachael Fee, MD   21 mg at 03/04/15 1000  . traZODone (DESYREL) tablet 50 mg  50 mg Oral QHS,MR X 1 Kerry Hough, PA-C   50 mg at 03/03/15 2058    Lab Results: No results found for this or any previous visit (from the past 48 hour(s)).  Physical Findings: AIMS: Facial and Oral Movements Muscles of Facial Expression: None, normal Lips and Perioral Area: None, normal Jaw: Minimal Tongue: None, normal,Extremity Movements Upper (arms, wrists, hands, fingers): None, normal Lower (legs, knees, ankles, toes): None, normal, Trunk Movements Neck, shoulders, hips: None, normal, Overall Severity Severity of abnormal movements (highest score from questions above): None, normal Incapacitation due to abnormal movements: None, normal Patient's awareness of abnormal movements (rate only patient's report): No Awareness, Dental Status Current problems with teeth and/or dentures?: Yes Does patient usually wear dentures?: No  CIWA:  CIWA-Ar Total: 4 COWS:  COWS Total Score: 5  Treatment Plan Summary: Daily contact with patient to assess and evaluate symptoms and progress in treatment and Medication management Supportive approach/coping skills Alcohol/cocaine dependence; continue to work a relapse prevention plan Depression; reassess for the use of an antidepressant. Ryan Reid prefers not to have to take medications for the depression if he was not to need it. He would rather wait. Continue to explore residential treatment options Medical Decision Making:  Review of Psycho-Social Stressors (1) and Review of Medication Regimen & Side Effects (2)     Jenicka Coxe A 03/04/2015, 3:35 PM

## 2015-03-04 NOTE — Clinical Social Work Note (Signed)
Pt has [facility] screening on Tuesday at 8am. Referral for [facility] faxed per pt request.  Trula Slade, [facility] Clinical Social Worker 03/04/2015 3:33 PM

## 2015-03-04 NOTE — Progress Notes (Signed)
Patient attended Karaoke group tonight.  

## 2015-03-04 NOTE — Progress Notes (Signed)
D:Patient in his room in bed on approach.  Patient states he had a good day.  Patient states his goal for today was to not drink alcohol and smoke crack.  Patient states he me his goal because he is here at [facility] and he is another day sober.  Patient states his plan is to go to [facility] when discharged.  Patient denies SI/HI/AVH. A: Staff to monitor Q 15 mins for safety.  Encouragement and support offered.  Scheduled medications administered per orders.  Vistaril administered prn for anxiety.   R: Patient remains safe on the unit.  Patient attended group tonight.  Patient visible on the unit and interacting with peers.

## 2015-03-04 NOTE — Clinical Social Work Note (Signed)
Pt not eligible for [facility]-due to having Medicare only. [facility] referral sent this morning. Message left requesting call back at their earliest convenience to schedule screening date.  Trula Slade, LCSWA Clinical Social Worker 03/04/2015 10:45 AM

## 2015-03-04 NOTE — Plan of Care (Signed)
Problem: Alteration in mood & ability to function due to Goal: LTG-Patient demonstrates decreased signs of withdrawal (Patient demonstrates decreased signs of withdrawal to the point the patient is safe to return home and continue treatment in an outpatient setting)  Outcome: Progressing Patient  verbalizes no signs and symptoms of withdrawals.

## 2015-03-04 NOTE — [facility] Group Notes (Addendum)
[facility] Group Notes:  (Nursing/MHT/Case Management/Adjunct)  Date:  03/04/2015  Time:  10:56 AM  Type of Therapy:  Nurse Education / Leisure Activity :  The group is focused on educating patients about the importance of incorporating leisure activities into their  Lives on a daily basis and how these activities can impact their recovery positively.   on a daily basis.  Participation Level:  Active  Participation Quality:  Appropriate  Affect:  Anxious  Cognitive:  Alert  Insight:  Appropriate  Engagement in Group:  Engaged  Modes of Intervention:  Education  Summary of Progress/Problems:  Ryan Reid 03/04/2015, 10:56 AM

## 2015-03-04 NOTE — Plan of Care (Signed)
Problem: Alteration in mood Goal: LTG-Patient reports reduction in suicidal thoughts (Patient reports reduction in suicidal thoughts and is able to verbalize a safety plan for whenever patient is feeling suicidal)  Outcome: Progressing Patient denies SI.      

## 2015-03-04 NOTE — [facility] Group Notes (Signed)
[facility] LCSW Group Therapy  03/04/2015 12:23 PM  Type of Therapy:  Group Therapy  Participation Level:  Did Not Attend-pt in room resting.   Modes of Intervention:  Confrontation, Discussion, Education, Exploration, Problem-solving, Rapport Building, Socialization and Support  Summary of Progress/Problems:  Finding Balance in Life. Today's group focused on defining balance in one's own words, identifying things that can knock one off balance, and exploring healthy ways to maintain balance in life. Group members were asked to provide an example of a time when they felt off balance, describe how they handled that situation,and process healthier ways to regain balance in the future. Group members were asked to share the most important tool for maintaining balance that they learned while at [facility] and how they plan to apply this method after discharge.   Smart, Chantrice Hagg LCSWA  03/04/2015, 12:23 PM

## 2015-03-05 NOTE — Progress Notes (Signed)
Patient did not attend group.

## 2015-03-05 NOTE — [facility] Group Notes (Signed)
[facility] LCSW Aftercare Discharge Planning Group Note   03/05/2015 10:01 AM  Participation Quality:  DID NOT ATTEND. Pt in room sleeping.   Smart, [employer]

## 2015-03-05 NOTE — Progress Notes (Signed)
D.  Pt pleasant on approach, denies complaints at this time.  Interacting appropriately with peers on the unit.  Denies SI/HI/hallucinatons at this time.  A.  Support and encouragement offered, medications given as ordered  R.  Pt remains safe on the unit, will continue to monitor.

## 2015-03-05 NOTE — Progress Notes (Signed)
Tonight in wrap up group Dijon stated that his day was a 5 he enjoyed his meals today from the cafe.

## 2015-03-05 NOTE — Clinical Social Work Note (Signed)
[facility] in an attempt to check status of referral. Enrique Sack took information and stated that someone would call back to discuss referral.  Trula Slade, LCSWA Clinical Social Worker 03/05/2015 3:39 PM

## 2015-03-05 NOTE — Progress Notes (Signed)
D:  Per pt self inventory pt reports sleeping a lot, appetite is improving, drinking ensure, energy level is fair, rates depression at a 3/10. Hygiene is poor encouraged pt to shower, pt is missing his front teeth. Pt contracts for safety.  A:  Support and encouragement provided, encouraged pt to attend all groups and activities, q15 minute checks continued for safety. Pt reports the longest period of sobriety was for 2 years but he really enjoys " Crack "  R- Will continue to monitor on q 15 minute checks for safety, compliant with medications and programing

## 2015-03-05 NOTE — [facility] Group Notes (Signed)
[facility] LCSW Group Therapy  03/05/2015 1:16 PM  Type of Therapy:  Group Therapy  Participation Level:  Active  Participation Quality:  Attentive  Affect:  Appropriate  Cognitive:  Alert  Insight:  Improving  Engagement in Therapy:  Improving  Modes of Intervention:  Confrontation, Discussion, Education, Exploration, Problem-solving, Rapport Building, Socialization and Support  Summary of Progress/Problems: Feelings around Relapse. Group members discussed the meaning of relapse and shared personal stories of relapse, how it affected them and others, and how they perceived themselves during this time. Group members were encouraged to identify triggers, warning signs and coping skills used when facing the possibility of relapse. Social supports were discussed and explored in detail. Post Acute Withdrawal Syndrome (handout provided) was introduced and examined. Pt's were encouraged to ask questions, talk about key points associated with PAWS, and process this information in terms of relapse prevention. Ryan Reid was attentive and engaged during today's processing group. He actively listened as other shared their experiences with PAWS. Ryan Reid stated that he wants to get help for his addiction and find healthier coping skills to deal with stress and depression.   Smart, [name] LCSWA 03/05/2015, 1:16 PM

## 2015-03-05 NOTE — Progress Notes (Signed)
[facility] MD Progress Note  03/05/2015 2:15 PM Hayward Washington  MRN:  [id number] Subjective:  Ryan Reid states he did not sleep too well last night. Not sure of where to go from here. He has a bed screening on Tuesday AM at [facility]. He states he has no where to go. Concerned about what can happen to him if he was to leave. He is not wanting to try medications as states he knows the way he feels has to do with coming off the alcohol and drugs. Would not want to take medications if he was not to need them Principal Problem: Substance induced mood disorder Diagnosis:   Patient Active Problem List   Diagnosis Date Noted  . Substance induced mood disorder [F19.94] 03/02/2015  . Alcohol dependence [F10.20] 03/02/2015  . Cocaine dependence [F14.20] 03/02/2015   Total Time spent with patient: 30 minutes   Past Medical History: History reviewed. No pertinent past medical history. History reviewed. No pertinent past surgical history. Family History: History reviewed. No pertinent family history. Social History:  History  Alcohol Use  . Yes    Comment: 1 beer a day     History  Drug Use  . Yes  . Special: Cocaine    Social History   Social History  . Marital Status: Single    Spouse Name: N/A  . Number of Children: N/A  . Years of Education: N/A   Social History Main Topics  . Smoking status: Current Every Day Smoker  . Smokeless tobacco: None  . Alcohol Use: Yes     Comment: 1 beer a day  . Drug Use: Yes    Special: Cocaine  . Sexual Activity: Not Asked   Other Topics Concern  . None   Social History Narrative   Additional History:    Sleep: Fair  Appetite:  Poor   Assessment:   Musculoskeletal: Strength & Muscle Tone: within normal limits Gait & Station: normal Patient leans: normal   Psychiatric Specialty Exam: Physical Exam  Review of Systems  Constitutional: Positive for malaise/fatigue.  HENT: Negative.   Eyes: Negative.   Respiratory: Negative.    Cardiovascular: Negative.   Gastrointestinal: Negative.   Genitourinary: Negative.   Musculoskeletal: Negative.   Skin: Negative.   Neurological: Positive for weakness.  Endo/Heme/Allergies: Negative.   Psychiatric/Behavioral: Positive for depression.    Blood pressure 112/78, pulse 82, temperature 98.6 F (37 C), temperature source Oral, resp. rate 18, height 5' 7.25" (1.708 m), weight 61.236 kg (135 lb), SpO2 100 %.Body mass index is 20.99 kg/(m^2).  General Appearance: Fairly Groomed  Patent attorney::  Fair  Speech:  Clear and Coherent  Volume:  Normal  Mood:  Depressed  Affect:  Restricted  Thought Process:  Coherent and Goal Directed  Orientation:  Full (Time, Place, and Person)  Thought Content:  worries concerns   Suicidal Thoughts:  No  Homicidal Thoughts:  No  Memory:  Immediate;   Fair Recent;   Fair Remote;   Fair  Judgement:  Fair  Insight:  Present and Shallow  Psychomotor Activity:  Decreased  Concentration:  Fair  Recall:  [employer] of Knowledge:Fair  Language: Fair  Akathisia:  No  Handed:  Right  AIMS (if indicated):     Assets:  Desire for Improvement  ADL's:  Intact  Cognition: WNL  Sleep:  Number of Hours: 5     Current Medications: Current Facility-Administered Medications  Medication Dose Route Frequency Provider Last Rate Last Dose  . acetaminophen (TYLENOL)  tablet 650 mg  650 mg Oral Q6H PRN Kerry Hough, PA-C      . alum & mag hydroxide-simeth (MAALOX/MYLANTA) 200-200-20 MG/5ML suspension 30 mL  30 mL Oral Q4H PRN Kerry Hough, PA-C      . feeding supplement (ENSURE ENLIVE) (ENSURE ENLIVE) liquid 237 mL  237 mL Oral BID BM Rachael Fee, MD   237 mL at 03/05/15 1059  . hydrOXYzine (ATARAX/VISTARIL) tablet 25 mg  25 mg Oral Q6H PRN Kerry Hough, PA-C   25 mg at 03/04/15 2143  . magnesium hydroxide (MILK OF MAGNESIA) suspension 30 mL  30 mL Oral Daily PRN Kerry Hough, PA-C      . nicotine (NICODERM CQ - dosed in mg/24 hours) patch  21 mg  21 mg Transdermal Daily Rachael Fee, MD   21 mg at 03/04/15 1000  . traZODone (DESYREL) tablet 50 mg  50 mg Oral QHS,MR X 1 Kerry Hough, PA-C   50 mg at 03/04/15 2143    Lab Results: No results found for this or any previous visit (from the past 48 hour(s)).  Physical Findings: AIMS: Facial and Oral Movements Muscles of Facial Expression: None, normal Lips and Perioral Area: None, normal Jaw: Minimal Tongue: None, normal,Extremity Movements Upper (arms, wrists, hands, fingers): None, normal Lower (legs, knees, ankles, toes): None, normal, Trunk Movements Neck, shoulders, hips: None, normal, Overall Severity Severity of abnormal movements (highest score from questions above): None, normal Incapacitation due to abnormal movements: None, normal Patient's awareness of abnormal movements (rate only patient's report): No Awareness, Dental Status Current problems with teeth and/or dentures?: Yes Does patient usually wear dentures?: No  CIWA:  CIWA-Ar Total: 4 COWS:  COWS Total Score: 5  Treatment Plan Summary: Daily contact with patient to assess and evaluate symptoms and progress in treatment and Medication management Supportive approach/coping skills Alcohol /cocaine dependence; continue to work a relapse prevention plan Mood instability; continue to reassess for the need for psychotropics Explore other residential treatment options that could have an earlier admission date, otherwise will wait unit Tuesday AM for [facility]. High risk for relapse, no family support, homeless Consider Campral for alcohol cravings  Medical Decision Making:  Review of Psycho-Social Stressors (1), Review or order clinical lab tests (1) and Review of Medication Regimen & Side Effects (2)     Donnae Michels A 03/05/2015, 2:15 PM

## 2015-03-06 DIAGNOSIS — F1994 Other psychoactive substance use, unspecified with psychoactive substance-induced mood disorder: Secondary | ICD-10-CM

## 2015-03-06 MED ORDER — TRAZODONE HCL 100 MG PO TABS
100.0000 mg | ORAL_TABLET | Freq: Every evening | ORAL | Status: DC | PRN
  Administered 2015-03-06 – 2015-03-08 (×3): 100 mg via ORAL
  Filled 2015-03-06 (×9): qty 1

## 2015-03-06 NOTE — [facility] Group Notes (Signed)
[facility] Group Notes:  (Clinical Social Work)  03/06/2015     10-11AM  Summary of Progress/Problems:   The main focus of today's process group was to learn how to use a decisional balance exercise to move forward in the Stages of Change, which were described and discussed.  Patients listed needs on the whiteboard and unhealthy coping techniques often used to fill needs.  Motivational Interviewing and the whiteboard were utilized to help patients explore in depth the perceived benefits and costs of unhealthy coping techniques, as well as the  benefits and costs of replacing that with a healthy coping skills.  A handout was distributed for patients to be able to do this exercise for themselves.   The patient expressed that their own unhealthy coping involves using drugs.  He was very alert throughout group and followed the discussion, made a few comments but mostly listened.  Type of Therapy:  Group Therapy - Process   Participation Level:  Active  Participation Quality:  Appropriate and Attentive  Affect:  Blunted  Cognitive:  Alert and Appropriate  Insight:  Developing/Improving  Engagement in Therapy:  Engaged  Modes of Intervention:  Education, Motivational Interviewing  Ambrose Mantle, LCSW 03/06/2015, 12:49 PM

## 2015-03-06 NOTE — Progress Notes (Signed)
D) Pt attends the groups and sits quietly in them offering some information at times. Listens to others and what they have to say. Rates his depression at a 5, hopelessness at an 8 and his anxiety at a 6. States he is glad that he came here because "I want help and this is the place to get it". Denies SI and HI A) Given support reassurance and praise along with encouragement. Provided with a 1:1. Therapeutic humor used with Pt. R) Denies SI and HI. Interacts pleasantly with the staff and his peers.

## 2015-03-06 NOTE — Progress Notes (Signed)
Patient ID: Ryan Reid, male   DOB: 01-Aug-1950,Ryan Reid: [id number] [facility] MD Progress Note  03/06/2015 4:24 PM Ryan Reid  MRN:  [id number] Subjective:  Ryan Reid c/o poor sleep.  He is still unsure of the plan for discharge.  He has a bed screening on Tuesday AM at [facility].  He is pleasant and calm.  Per nursing he has gotten along with staff and other patients.    Principal Problem: Substance induced mood disorder Diagnosis:   Patient Active Problem List   Diagnosis Date Noted  . Substance induced mood disorder [F19.94] 03/02/2015  . Alcohol dependence [F10.20] 03/02/2015  . Cocaine dependence [F14.20] 03/02/2015   Total Time spent with patient: 30 minutes   Past Medical History: History reviewed. No pertinent past medical history. History reviewed. No pertinent past surgical history. Family History: History reviewed. No pertinent family history. Social History:  History  Alcohol Use  . Yes    Comment: 1 beer a day     History  Drug Use  . Yes  . Special: Cocaine    Social History   Social History  . Marital Status: Single    Spouse Name: N/A  . Number of Children: N/A  . Years of Education: N/A   Social History Main Topics  . Smoking status: Current Every Day Smoker  . Smokeless tobacco: None  . Alcohol Use: Yes     Comment: 1 beer a day  . Drug Use: Yes    Special: Cocaine  . Sexual Activity: Not Asked   Other Topics Concern  . None   Social History Narrative   Additional History:    Sleep: Fair  Appetite:  Poor   Assessment:   Musculoskeletal: Strength & Muscle Tone: within normal limits Gait & Station: normal Patient leans: normal   Psychiatric Specialty Exam: Physical Exam  Review of Systems  Constitutional: Positive for malaise/fatigue.  HENT: Negative.   Eyes: Negative.   Respiratory: Negative.   Cardiovascular: Negative.   Gastrointestinal: Negative.   Genitourinary: Negative.   Musculoskeletal: Negative.   Skin:  Negative.   Neurological: Positive for weakness.  Endo/Heme/Allergies: Negative.   Psychiatric/Behavioral: Positive for depression.    Blood pressure 113/81, pulse 72, temperature 98.2 F (36.8 C), temperature source Oral, resp. rate 16, height 5' 7.25" (1.708 m), weight 61.236 kg (135 lb), SpO2 100 %.Body mass index is 20.99 kg/(m^2).  General Appearance: Neat  Eye Contact::  Fair  Speech:  Clear and Coherent  Volume:  Normal  Mood:  Depressed  Affect:  Restricted  Thought Process:  Coherent and Goal Directed  Orientation:  Full (Time, Place, and Person)  Thought Content:  worries concerns   Suicidal Thoughts:  No  Homicidal Thoughts:  No  Memory:  Immediate;   Fair Recent;   Fair Remote;   Fair  Judgement:  Fair  Insight:  Present and Shallow  Psychomotor Activity:  Decreased  Concentration:  Fair  Recall:  [employer] of Knowledge:Fair  Language: Fair  Akathisia:  No  Handed:  Right  AIMS (if indicated):     Assets:  Desire for Improvement  ADL's:  Intact  Cognition: WNL  Sleep:  Number of Hours: 5     Current Medications: Current Facility-Administered Medications  Medication Dose Route Frequency Provider Last Rate Last Dose  . acetaminophen (TYLENOL) tablet 650 mg  650 mg Oral Q6H PRN Kerry Hough, PA-C      . alum & mag hydroxide-simeth (MAALOX/MYLANTA) 200-200-20 MG/5ML  suspension 30 mL  30 mL Oral Q4H PRN Kerry Hough, PA-C      . feeding supplement (ENSURE ENLIVE) (ENSURE ENLIVE) liquid 237 mL  237 mL Oral BID BM Rachael Fee, MD   237 mL at 03/06/15 1604  . hydrOXYzine (ATARAX/VISTARIL) tablet 25 mg  25 mg Oral Q6H PRN Kerry Hough, PA-C   25 mg at 03/04/15 2143  . magnesium hydroxide (MILK OF MAGNESIA) suspension 30 mL  30 mL Oral Daily PRN Kerry Hough, PA-C      . nicotine (NICODERM CQ - dosed in mg/24 hours) patch 21 mg  21 mg Transdermal Daily Rachael Fee, MD   21 mg at 03/06/15 0815  . traZODone (DESYREL) tablet 100 mg  100 mg Oral QHS,MR X  1 Adonis Brook, NP        Lab Results: No results found for this or any previous visit (from the past 48 hour(s)).  Physical Findings: AIMS: Facial and Oral Movements Muscles of Facial Expression: None, normal Lips and Perioral Area: None, normal Jaw: None, normal Tongue: None, normal,Extremity Movements Upper (arms, wrists, hands, fingers): None, normal Lower (legs, knees, ankles, toes): None, normal, Trunk Movements Neck, shoulders, hips: None, normal, Overall Severity Severity of abnormal movements (highest score from questions above): None, normal Incapacitation due to abnormal movements: None, normal Patient's awareness of abnormal movements (rate only patient's report): No Awareness, Dental Status Current problems with teeth and/or dentures?: No Does patient usually wear dentures?: No  CIWA:  CIWA-Ar Total: 0 COWS:  COWS Total Score: 5  Treatment Plan Summary: Daily contact with patient to assess and evaluate symptoms and progress in treatment and Medication managementTrazodone increased to 100 mg QHS Supportive approach/coping skills Alcohol /cocaine dependence; continue to work a relapse prevention plan Mood instability; continue to reassess for the need for psychotropics Explore other residential treatment options that could have an earlier admission date, otherwise will wait unit Tuesday AM for [facility]. High risk for relapse, no family support, homeless Consider Campral for alcohol cravings  Medical Decision Making:  Review of Psycho-Social Stressors (1), Review or order clinical lab tests (1) and Review of Medication Regimen & Side Effects (2)   Velna Hatchet May Haelyn Forgey AGNP-BC 03/06/2015, 4:24 PM

## 2015-03-06 NOTE — [facility] Group Notes (Signed)
Adult Psychoeducational Group Note  Date:  03/06/2015 Time:  8:00PM Group Topic/Focus:  Wrap-Up Group:   The focus of this group is to help patients review their daily goal of treatment and discuss progress on daily workbooks.  Participation Level:  Active  Participation Quality:  Appropriate and Attentive  Affect:  Appropriate  Cognitive:  Alert and Appropriate  Insight: Appropriate  Engagement in Group:  Engaged  Modes of Intervention:  Discussion  Additional Comments:  Pt. Was attentive and appropraite during AA wrap up group.   Bing Plume D 03/06/2015, 10:39 PM

## 2015-03-06 NOTE — [facility] Group Notes (Addendum)
[facility] Group Notes:  (Nursing/MHT/Case Management/Adjunct)  Date:  03/06/2015  Time:  10:25 AM  Type of Therapy:  Nurse Education /  Goals Group : The group is focused on teaching patients how to set attainable goals as well as how to deveop the skills needed to accomplish these goals.  Participation Level:  Did Not Attend  Participation Quality:  N/A   Affect:  N/A  Cognitive:  N/A  Insight:N/A    Engagement in Group:    Modes of Intervention:    Summary of Progress/Problems:  Ryan Reid 03/06/2015, 10:25 AM

## 2015-03-06 NOTE — Progress Notes (Signed)
Psychoeducational Group Note  Date: 03/06/2015 Time: 1015  Group Topic/Focus:  Identifying Needs:   The focus of this group is to help patients identify their personal needs that have been historically problematic and identify healthy behaviors to address their needs.  Participation Level:  Active  Participation Quality:  Appropriate  Affect:  Blunted  Cognitive:  Alert  Insight:  Engaged  Engagement in Group:  Engaged  Additional Comments:    8/20/20164:32 PM Jalani Cullifer, Joie Bimler

## 2015-03-06 NOTE — Progress Notes (Signed)
D.  Pt pleasant on approach, denies complaints at this time.  Pt pleased that Trazodone dose was raised to 100 mg for tonight and feels that he will be able to sleep much better.  Denies SI/HI/hallucinations at this time.  Positive for evening AA group, interacting appropriately with peers on the unit.  A.  Support and encouragement offered  R.  Pt remains safe on the unit, will continue to monitor.

## 2015-03-07 NOTE — Progress Notes (Signed)
[facility] MD Progress Note  03/07/2015 5:03 PM Ryan Reid  MRN:  [id number] Subjective:  Ryan Reid c/o poor sleep yesterday.  Today states, he was able to sleep.  He has no c/o.  He states he is feeling better.  He has a bed screening on Tuesday AM at [facility].  He is pleasant and calm.  Per nursing he has gotten along with staff and other patients.    Principal Problem: Substance induced mood disorder Diagnosis:   Patient Active Problem List   Diagnosis Date Noted  . Substance induced mood disorder [F19.94] 03/02/2015  . Alcohol dependence [F10.20] 03/02/2015  . Cocaine dependence [F14.20] 03/02/2015   Total Time spent with patient: 30 minutes   Past Medical History: History reviewed. No pertinent past medical history. History reviewed. No pertinent past surgical history. Family History: History reviewed. No pertinent family history. Social History:  History  Alcohol Use  . Yes    Comment: 1 beer a day     History  Drug Use  . Yes  . Special: Cocaine    Social History   Social History  . Marital Status: Single    Spouse Name: N/A  . Number of Children: N/A  . Years of Education: N/A   Social History Main Topics  . Smoking status: Current Every Day Smoker  . Smokeless tobacco: None  . Alcohol Use: Yes     Comment: 1 beer a day  . Drug Use: Yes    Special: Cocaine  . Sexual Activity: Not Asked   Other Topics Concern  . None   Social History Narrative   Additional History:    Sleep: Fair  Appetite:  Poor   Assessment:   Musculoskeletal: Strength & Muscle Tone: within normal limits Gait & Station: normal Patient leans: normal   Psychiatric Specialty Exam: Physical Exam  Review of Systems  Constitutional: Positive for malaise/fatigue.  HENT: Negative.   Eyes: Negative.   Respiratory: Negative.   Cardiovascular: Negative.   Gastrointestinal: Negative.   Genitourinary: Negative.   Musculoskeletal: Negative.   Skin: Negative.   Neurological:  Positive for weakness.  Endo/Heme/Allergies: Negative.   Psychiatric/Behavioral: Positive for depression.    Blood pressure 143/77, pulse 79, temperature 98 F (36.7 C), temperature source Oral, resp. rate 16, height 5' 7.25" (1.708 m), weight 61.236 kg (135 lb), SpO2 100 %.Body mass index is 20.99 kg/(m^2).  General Appearance: Neat  Eye Contact::  Fair  Speech:  Clear and Coherent  Volume:  Normal  Mood:  Depressed  Affect:  Restricted  Thought Process:  Coherent and Goal Directed  Orientation:  Full (Time, Place, and Person)  Thought Content:  worries concerns   Suicidal Thoughts:  No  Homicidal Thoughts:  No  Memory:  Immediate;   Fair Recent;   Fair Remote;   Fair  Judgement:  Fair  Insight:  Present and Shallow  Psychomotor Activity:  Decreased  Concentration:  Fair  Recall:  [employer] of Knowledge:Fair  Language: Fair  Akathisia:  No  Handed:  Right  AIMS (if indicated):     Assets:  Desire for Improvement  ADL's:  Intact  Cognition: WNL  Sleep:  Number of Hours: 5     Current Medications: Current Facility-Administered Medications  Medication Dose Route Frequency Provider Last Rate Last Dose  . acetaminophen (TYLENOL) tablet 650 mg  650 mg Oral Q6H PRN Ryan Hough, PA-C      . alum & mag hydroxide-simeth (MAALOX/MYLANTA) 200-200-20 MG/5ML suspension 30 mL  30 mL Oral Q4H PRN Ryan Hough, PA-C      . feeding supplement (ENSURE ENLIVE) (ENSURE ENLIVE) liquid 237 mL  237 mL Oral BID BM Ryan Fee, MD   237 mL at 03/06/15 1604  . hydrOXYzine (ATARAX/VISTARIL) tablet 25 mg  25 mg Oral Q6H PRN Ryan Hough, PA-C   25 mg at 03/04/15 2143  . magnesium hydroxide (MILK OF MAGNESIA) suspension 30 mL  30 mL Oral Daily PRN Ryan Hough, PA-C      . nicotine (NICODERM CQ - dosed in mg/24 hours) patch 21 mg  21 mg Transdermal Daily Ryan Fee, MD   21 mg at 03/06/15 0815  . traZODone (DESYREL) tablet 100 mg  100 mg Oral QHS,MR X 1 Ryan Brook, NP   100 mg  at 03/06/15 2123    Lab Results: No results found for this or any previous visit (from the past 48 hour(s)).  Physical Findings: AIMS: Facial and Oral Movements Muscles of Facial Expression: None, normal Lips and Perioral Area: None, normal Jaw: None, normal Tongue: None, normal,Extremity Movements Upper (arms, wrists, hands, fingers): None, normal Lower (legs, knees, ankles, toes): None, normal, Trunk Movements Neck, shoulders, hips: None, normal, Overall Severity Severity of abnormal movements (highest score from questions above): None, normal Incapacitation due to abnormal movements: None, normal Patient's awareness of abnormal movements (rate only patient's report): No Awareness, Dental Status Current problems with teeth and/or dentures?: No Does patient usually wear dentures?: No  CIWA:  CIWA-Ar Total: 2 COWS:  COWS Total Score: 5  Treatment Plan Summary: Daily contact with patient to assess and evaluate symptoms and progress in treatment and Medication managementTrazodone increased to 100 mg QHS Supportive approach/coping skills Alcohol /cocaine dependence; continue to work a relapse prevention plan Mood instability; continue to reassess for the need for psychotropics Explore other residential treatment options that could have an earlier admission date, otherwise will wait unit Tuesday AM for [facility]. High risk for relapse, no family support, homeless Consider Campral for alcohol cravings  Medical Decision Making:  Review of Psycho-Social Stressors (1), Review or order clinical lab tests (1) and Review of Medication Regimen & Side Effects (2)   Ryan Reid Ryan Reid AGNP-BC 03/07/2015, 5:03 PM

## 2015-03-07 NOTE — Progress Notes (Signed)
Patient did attend the evening speaker AA meeting.  

## 2015-03-07 NOTE — Progress Notes (Signed)
Psychoeducational Group Note  Date:  03/07/2015 Time: 1015 Group Topic/Focus:  Making Healthy Choices:   The focus of this group is to help patients identify negative/unhealthy choices they were using prior to admission and identify positive/healthier coping strategies to replace them upon discharge.  Participation Level:  Active  Participation Quality:  Appropriate  Affect:  Appropriate  Cognitive:  Alert  Insight:  Engaged  Engagement in Group:  Engaged  Additional Comments:    Ryan Reid 7:12 PM. 03/07/2015

## 2015-03-07 NOTE — [facility] Group Notes (Signed)
[facility] Group Notes:  (Clinical Social Work)  03/07/2015  10:00-11:00AM  Summary of Progress/Problems:   The main focus of today's process group was to   1)  discuss the importance of adding supports  2)  define health supports versus unhealthy supports  3)  identify the patient's current unhealthy supports and plan how to handle them  4)  Identify the patient's current healthy supports and plan what to add.  An emphasis was placed on using counselor, doctor, therapy groups, 12-step groups, and problem-specific support groups to expand supports.    The patient expressed full comprehension of the concepts presented, and agreed that there is a need to add more supports.  The patient stated his adult sons aged 14 and 38yo are his main supports, and explained to the group how.  He was very interactive throughout group today, made a lot of observations and contributions that were appreciated by others.  He talked about shutting old friends out of his life, and a long discussion on that ensued.  Type of Therapy:  Process Group with Motivational Interviewing  Participation Level:  Active  Participation Quality:  Appropriate, Attentive, Sharing and Supportive  Affect:  Appropriate  Cognitive:  Alert  Insight:  Engaged  Engagement in Therapy:  Engaged  Modes of Intervention:   Education, Support and Processing, Activity  Ambrose Mantle, LCSW 03/07/2015

## 2015-03-07 NOTE — Progress Notes (Signed)
Psychoeducational Group Note  Date:  03/07/2015 Time: 1015 Group Topic/Focus:  Making Healthy Choices:   The focus of this group is to help patients identify negative/unhealthy choices they were using prior to admission and identify positive/healthier coping strategies to replace them upon discharge.  Participation Level:  Active  Participation Quality:  Appropriate  Affect:  Appropriate  Cognitive:  Appropriate  Insight:  Engaged  Engagement in Group:  Engaged  Additional Comments:    Rich Brave 7:11 PM. 03/07/2015

## 2015-03-07 NOTE — Plan of Care (Signed)
Problem: Alteration in mood & ability to function due to Goal: STG-Patient will attend groups Outcome: Progressing Pt has attended all groups

## 2015-03-07 NOTE — Progress Notes (Signed)
D) Pt has attended the groups and will occasionally make a comment or statement in group. Otherwise remains shy and does not volunteer much. Affect is flat and mood depressed. Pt states he denies SI/HI delusions and hallucinations. Often goes to his room and is withdrawn. A) Encouraged to go to the groups and to interact with his peers. Provided with a 1:1. R) Pt denies SI and HI.

## 2015-03-07 NOTE — Progress Notes (Signed)
D.  Pt pleasant on approach, denies complaints at this time.  Positive for evening AA group, interacting appropriately within the milieu.  Denies SI/HI/hallucinations at this time.  Pt expects to discharge tomorrow.  A.  Support and encouragement offered.  R.  Pt remains safe on the unit, will continue to monitor.

## 2015-03-08 MED ORDER — TRAZODONE HCL 100 MG PO TABS: 100.0000 mg | ORAL_TABLET | Freq: Every evening | ORAL | Status: DC | PRN

## 2015-03-08 MED ORDER — NICOTINE 21 MG/24HR TD PT24: 21.0000 mg | MEDICATED_PATCH | Freq: Every day | TRANSDERMAL | Status: DC

## 2015-03-08 MED ORDER — HYDROXYZINE HCL 25 MG PO TABS: 25.0000 mg | ORAL_TABLET | Freq: Four times a day (QID) | ORAL | Status: AC | PRN

## 2015-03-08 NOTE — Plan of Care (Signed)
Problem: Alteration in mood & ability to function due to Goal: STG-Patient will attend groups Outcome: Progressing Pt has attended all groups      

## 2015-03-08 NOTE — Progress Notes (Signed)
[facility] MD Progress Note  03/08/2015 6:29 PM Ryan Reid  MRN:  [id number] Subjective:  Ryan Reid states he really needs to pursue the residential treatment program. He is going to [facility] in the AM. Admits he needs more help past detox.  Afterwards he is not sure what he needs to do Principal Problem: Substance induced mood disorder Diagnosis:   Patient Active Problem List   Diagnosis Date Noted  . Substance induced mood disorder [F19.94] 03/02/2015  . Alcohol dependence [F10.20] 03/02/2015  . Cocaine dependence [F14.20] 03/02/2015   Total Time spent with patient: 20 minutes   Past Medical History: History reviewed. No pertinent past medical history. History reviewed. No pertinent past surgical history. Family History: History reviewed. No pertinent family history. Social History:  History  Alcohol Use  . Yes    Comment: 1 beer a day     History  Drug Use  . Yes  . Special: Cocaine    Social History   Social History  . Marital Status: Single    Spouse Name: N/A  . Number of Children: N/A  . Years of Education: N/A   Social History Main Topics  . Smoking status: Current Every Day Smoker  . Smokeless tobacco: None  . Alcohol Use: Yes     Comment: 1 beer a day  . Drug Use: Yes    Special: Cocaine  . Sexual Activity: Not Asked   Other Topics Concern  . None   Social History Narrative   Additional History:    Sleep: Fair  Appetite:  Fair   Assessment:   Musculoskeletal: Strength & Muscle Tone: within normal limits Gait & Station: normal Patient leans: normal   Psychiatric Specialty Exam: Physical Exam  Review of Systems  Constitutional: Negative.   HENT: Negative.   Eyes: Negative.   Respiratory: Negative.   Cardiovascular: Negative.   Gastrointestinal: Negative.   Genitourinary: Negative.   Musculoskeletal: Negative.   Skin: Negative.   Neurological: Negative.   Endo/Heme/Allergies: Negative.   Psychiatric/Behavioral: Positive for substance  abuse. The patient is nervous/anxious.     Blood pressure 122/76, pulse 73, temperature 97.6 F (36.4 C), temperature source Oral, resp. rate 16, height 5' 7.25" (1.708 m), weight 61.236 kg (135 lb), SpO2 100 %.Body mass index is 20.99 kg/(m^2).  General Appearance: Fairly Groomed  Patent attorney::  Fair  Speech:  Clear and Coherent  Volume:  Decreased  Mood:  Anxious  Affect:  Restricted  Thought Process:  Coherent and Goal Directed  Orientation:  Full (Time, Place, and Person)  Thought Content:  plans as he moves on, relapse prevention  Suicidal Thoughts:  No  Homicidal Thoughts:  No  Memory:  Immediate;   Fair Recent;   Fair Remote;   Fair  Judgement:  Fair  Insight:  Shallow  Psychomotor Activity:  Normal  Concentration:  Fair  Recall:  [employer] of Knowledge:Fair  Language: Fair  Akathisia:  No  Handed:  Right  AIMS (if indicated):     Assets:  Desire for Improvement  ADL's:  Intact  Cognition: WNL  Sleep:  Number of Hours: 5     Current Medications: Current Facility-Administered Medications  Medication Dose Route Frequency Provider Last Rate Last Dose  . acetaminophen (TYLENOL) tablet 650 mg  650 mg Oral Q6H PRN Kerry Hough, PA-C      . alum & mag hydroxide-simeth (MAALOX/MYLANTA) 200-200-20 MG/5ML suspension 30 mL  30 mL Oral Q4H PRN Kerry Hough, PA-C      .  feeding supplement (ENSURE ENLIVE) (ENSURE ENLIVE) liquid 237 mL  237 mL Oral BID BM Rachael Fee, MD   237 mL at 03/08/15 1505  . hydrOXYzine (ATARAX/VISTARIL) tablet 25 mg  25 mg Oral Q6H PRN Kerry Hough, PA-C   25 mg at 03/04/15 2143  . magnesium hydroxide (MILK OF MAGNESIA) suspension 30 mL  30 mL Oral Daily PRN Kerry Hough, PA-C      . nicotine (NICODERM CQ - dosed in mg/24 hours) patch 21 mg  21 mg Transdermal Daily Rachael Fee, MD   21 mg at 03/06/15 0815  . traZODone (DESYREL) tablet 100 mg  100 mg Oral QHS,MR X 1 Adonis Brook, NP   100 mg at 03/07/15 2127    Lab Results: No results  found for this or any previous visit (from the past 48 hour(s)).  Physical Findings: AIMS: Facial and Oral Movements Muscles of Facial Expression: None, normal Lips and Perioral Area: None, normal Jaw: None, normal Tongue: None, normal,Extremity Movements Upper (arms, wrists, hands, fingers): None, normal Lower (legs, knees, ankles, toes): None, normal, Trunk Movements Neck, shoulders, hips: None, normal, Overall Severity Severity of abnormal movements (highest score from questions above): None, normal Incapacitation due to abnormal movements: None, normal Patient's awareness of abnormal movements (rate only patient's report): No Awareness, Dental Status Current problems with teeth and/or dentures?: No Does patient usually wear dentures?: No  CIWA:  CIWA-Ar Total: 2 COWS:  COWS Total Score: 5  Treatment Plan Summary: Daily contact with patient to assess and evaluate symptoms and progress in treatment and Medication management Supportive approach/coping skills Alcohol/cocaine dependence; continue to work a relapse prevention plan Mood disorder ( substance induced) stabilizing as he continues to abstain Facilitate getting to [facility] in the morning  Medical Decision Making:  Review of Psycho-Social Stressors (1) and Review of Medication Regimen & Side Effects (2)     Jerlean Peralta A 03/08/2015, 6:29 PM

## 2015-03-08 NOTE — Progress Notes (Signed)
Recreation Therapy Notes  Date: 08.22.2016 Time: 9:30am Location: [address] Day[address]   Group Topic: Stress Management  Goal Area(s) Addresses:  Patient will actively participate in stress management techniques presented during session.   Behavioral Response: Did not attend.   Vasilia Dise L Ryo Klang, LRT/CTRS  Shere Eisenhart L 03/08/2015 1:43 PM 

## 2015-03-08 NOTE — [facility] Group Notes (Signed)
[facility] LCSW Aftercare Discharge Planning Group Note   03/08/2015 11:33 AM  Participation Quality:  Appropriate/minimal   Mood/Affect:  Flat  Depression Rating:  0  Anxiety Rating:  0  Thoughts of Suicide:  No Will you contract for safety?   NA  Current AVH:  No  Plan for Discharge/Comments:  Pt denies withdrawal sx and understands instructions [facility]. Pt presents with flat affect this morning. Lethargic.   Transportation Means: bus   Supports: none identified   Counselling psychologist, Oncologist

## 2015-03-08 NOTE — Discharge Summary (Signed)
Physician Discharge Summary Note  Patient:  Ryan Reid is an 64 y.o., male  MRN:  [id number]  DOB:  [birth year]  Patient phone:  [phone] (home)   Patient address:   [address] [place] [address],   Total Time spent with patient: Greater than 30 minutes  Date of Admission:  03/02/2015  Date of Discharge: 03-09-15  Reason for Admission: Alcohol withdrawal symptoms  Principal Problem: Substance induced mood disorder  Discharge Diagnoses: Patient Active Problem List   Diagnosis Date Noted  . Substance induced mood disorder [F19.94] 03/02/2015  . Alcohol dependence [F10.20] 03/02/2015  . Cocaine dependence [F14.20] 03/02/2015   Musculoskeletal: Strength & Muscle Tone: within normal limits Gait & Station: normal Patient leans: N/A  Psychiatric Specialty Exam: Physical Exam  Psychiatric: His speech is normal and behavior is normal. Judgment and thought content normal. His mood appears not anxious. His affect is not angry, not blunt, not labile and not inappropriate. Cognition and memory are normal. He does not exhibit a depressed mood.    Review of Systems  Constitutional: Negative.   HENT: Negative.   Eyes: Negative.   Respiratory: Negative.   Cardiovascular: Negative.   Gastrointestinal: Negative.   Genitourinary: Negative.   Musculoskeletal: Negative.   Skin: Negative.   Neurological: Negative.   Endo/Heme/Allergies: Negative.   Psychiatric/Behavioral: Positive for depression (Stable) and substance abuse (Alcohol dependence). Negative for suicidal ideas, hallucinations and memory loss. The patient has insomnia (Stable). The patient is not nervous/anxious.     Blood pressure 123/76, pulse 66, temperature 97.6 F (36.4 C), temperature source Oral, resp. rate 17, height 5' 7.25" (1.708 m), weight 61.236 kg (135 lb), SpO2 100 %.Body mass index is 20.99 kg/(m^2).  See Md's SRA   Have you used any form of tobacco in the last 30 days? (Cigarettes, Smokeless  Tobacco, Cigars, and/or Pipes): Yes  Has this patient used any form of tobacco in the last 30 days? (Cigarettes, Smokeless Tobacco, Cigars, and/or Pipes): Yes, Prescription not provided because: nicotine patches will be given  Past Medical History: History reviewed. No pertinent past medical history. History reviewed. No pertinent past surgical history.  Family History: History reviewed. No pertinent family history.  Social History:  History  Alcohol Use  . Yes    Comment: 1 beer a day     History  Drug Use  . Yes  . Special: Cocaine    Social History   Social History  . Marital Status: Single    Spouse Name: N/A  . Number of Children: N/A  . Years of Education: N/A   Social History Main Topics  . Smoking status: Current Every Day Smoker  . Smokeless tobacco: None  . Alcohol Use: Yes     Comment: 1 beer a day  . Drug Use: Yes    Special: Cocaine  . Sexual Activity: Not Asked   Other Topics Concern  . None   Social History Narrative   Risk to Self: Is patient at risk for suicide?: No What has been your use of drugs/alcohol within the last 12 months?: daily crack cocaine use. sporadic alcohol use-amount increased over past few months to about 3-4 times per week. no other drug use reported. pt reports one year of sobriety about 10 years ago.  Risk to Others: No Prior Inpatient Therapy: Yes Prior Outpatient Therapy: Yes  Level of Care:  OP  Hospital Course:  This is a 64 year old AA male. Admitted to [facility] from the [facility] with  complaints of suicidal/homicidal ideations & auditory hallucination, seeking substance abuse treatments. Fahd reports, "The Transportation Zenaida Niece took me to the hospital. I called them because my mind was wondering off. This has been going on for a while. I had this problem about one year ago. I also called the ambulance that took me to the [facility]. I was told then that I had depression & HTN. I don't remember the medicines I  was given, but I stopped taking the Prozac because it was killing people. I have been using crack cocaine for 40 years. I stopped using about 1 year, then relapsed. I attempted to kill myself 1 year ago by trying to cut myself open. I drink as much alcohol as I can get any day. I was thinking about hurting the drug dealer that deals the cocaine I was using, but could not get to him. I'm still thinking about killing him".  Upon his arrival & admision to the adult unit, Elijio was evaluated & his presenting symptoms identified. His lab results showed a blood alcohol level of <5 & UDS negative results, although he reported having been drinking alcohol & using cocaine. He was also not presenting with any substance withdrawal symptoms. He did not receive detoxification treatments as result, but was started on Trazodone 100 mg for insomnia, Hydroxyzine 25 mg prn for anxiety & nicotine patch for nicotine withdrawal symptoms. He was rather enrolled & encouraged to participate in the unit counseling services, he did & learned coping skills that should help him cope better & maintain mood stability. He presented no other significant health issues that required treatment or monitoring. Korby was evaluated on daily basis by the clinical providers to assure his response to his treatment regimen.As his treatment progressed, improvement was noted as evidenced by his reports of decreasing symptoms, improved sleep, mood, affect, medication tolerance & active participation in the unit programming. He was encouraged to update his providers on his progress by daily completion of a self inventory assessment, noting mood, mental status, pain, any new symptoms, anxiety and or concerns.  Rahsaan's symptomssponded well to his treatment regimen combined with a therapeutic and supportive environment. He was motivated for recovery as evidenced by a positive/appropriate behavior and his interaction with the staff & fellow patients.He  also worked closely with the treatment team and case manager to develop a discharge plan with appropriate goals to maintain mood stability after discharge. Coping skills, problem solving as well as relaxation therapies were also part of the unit programming.  Upon his hospital discharge, Ezequiel is in much improved condition than upon admission.His symptoms were reported as significantly decreased or resolved completely. He adamantly denies any SIHI,  AVH, delusional thoughts & or paranoia. He was motivated to continue taking medication with a goal of continued improvement in mental health. He will continue psychiatric care on an outpatient basis as noted below. He has a screening for substance abuse treatment at the [facility] today & a [facility] to [facility] in [place], [place]. He is provided with all the necessary information required to make these appointments without problems. He received a 14 days worth, supply samples of his [facility] discharge medications. He left [facility] with all personal belongings in no apparent distress. Transportation per [employer]. [facility] assisted with Taxi fare..  Consults:  psychiatry  Significant Diagnostic Studies:  labs: CBC with diff, CMP, UDS, toxicology tests, U/A, results revieqwed, stable  Discharge Vitals:   Blood pressure 123/76, pulse 66, temperature 97.6  F (36.4 C), temperature source Oral, resp. rate 17, height 5' 7.25" (1.708 m), weight 61.236 kg (135 lb), SpO2 100 %. Body mass index is 20.99 kg/(m^2). Lab Results:   No results found for this or any previous visit (from the past 72 hour(s)).  Physical Findings: AIMS: Facial and Oral Movements Muscles of Facial Expression: None, normal Lips and Perioral Area: None, normal Jaw: None, normal Tongue: None, normal,Extremity Movements Upper (arms, wrists, hands, fingers): None, normal Lower (legs, knees, ankles, toes): None, normal, Trunk Movements Neck, shoulders, hips: None,  normal, Overall Severity Severity of abnormal movements (highest score from questions above): None, normal Incapacitation due to abnormal movements: None, normal Patient's awareness of abnormal movements (rate only patient's report): No Awareness, Dental Status Current problems with teeth and/or dentures?: No Does patient usually wear dentures?: No  CIWA:  CIWA-Ar Total: 2 COWS:  COWS Total Score: 5   See Psychiatric Specialty Exam and Suicide Risk Assessment completed by Attending Physician prior to discharge.  Discharge destination:  Home  Is patient on multiple antipsychotic therapies at discharge:  No   Has Patient had three or more failed trials of antipsychotic monotherapy by history:  No  Recommended Plan for Multiple Antipsychotic Therapies: NA    Medication List    TAKE these medications      Indication   hydrOXYzine 25 MG tablet  Commonly known as:  ATARAX/VISTARIL  Take 1 tablet (25 mg total) by mouth every 6 (six) hours as needed for anxiety.   Indication:  Anxiety     nicotine 21 mg/24hr patch  Commonly known as:  NICODERM CQ - dosed in mg/24 hours  Place 1 patch (21 mg total) onto the skin daily. For smoking cessation   Indication:  Nicotine Addiction     traZODone 100 MG tablet  Commonly known as:  DESYREL  Take 1 tablet (100 mg total) by mouth at bedtime and may repeat dose one time if needed. For sleep   Indication:  Trouble Sleeping       Follow-up Information    Follow up with [facility].   Why:  Walk in between 8am-9am Monday through Friday for hospital follow-up/medication management/assessment for mental health services.    Contact information:   [address], [place] [address] Phone: [phone] Fax: [phone]      Follow up with [facility]  On 03/09/2015.   Why:  Screeing for possible admission on this date at 8:00AM. Make sure to bring photo ID/proof of [place] residency and Medicare card.    Contact information:    [address] [place], [place] [address] Phone: [phone] Fax: [phone]      Follow up with [facility].   Why:  Referral sent: 3:30PM on 03/04/15. Please follow-up with them after discharge if still interested in this program.    Contact information:   [address] [place], [place] [address] Phone: [phone] Fax: [phone]     Follow-up recommendations: Activity:  As tolerated Diet: As recommended by your primary care doctor. Keep all scheduled follow-up appointments as recommended.   Comments: Take all your medications as prescribed by your mental healthcare provider. Report any adverse effects and or reactions from your medicines to your outpatient provider promptly. Patient is instructed and cautioned to not engage in alcohol and or illegal drug use while on prescription medicines. In the event of worsening symptoms, patient is instructed to call the crisis hotline, 911 and or go to the  nearest ED for appropriate evaluation and treatment of symptoms. Follow-up with your primary care provider for your other medical issues, concerns and or health care needs.   Total Discharge Time: Greater than 30 minutes  Signed: Sanjuana Kava, PMHMNP, FNP-BC 03/09/2015, 1:02 PM  I personally assessed the patient and formulated the plan Madie Reno A. Dub Mikes, M.D.

## 2015-03-08 NOTE — Care Management Utilization Note (Signed)
   Per State Regulation 482.30  This chart was reviewed for necessity with respect to the patient's Admission/ Duration of stay.  Next review date:  03/10/15  Nicolasa Ducking RN, BSN

## 2015-03-08 NOTE — [facility] Suicide Risk Assessment (Signed)
[facility] Discharge Suicide Risk Assessment   Demographic Factors:  Male  Total Time spent with patient: 30 minutes  Musculoskeletal: Strength & Muscle Tone: within normal limits Gait & Station: normal Patient leans: normal  Psychiatric Specialty Exam: Physical Exam  ROS  Blood pressure 122/76, pulse 73, temperature 97.6 F (36.4 C), temperature source Oral, resp. rate 16, height 5' 7.25" (1.708 m), weight 61.236 kg (135 lb), SpO2 100 %.Body mass index is 20.99 kg/(m^2).  General Appearance: Fairly Groomed  Patent attorney::  Fair  Speech:  Clear and Coherent409  Volume:  Decreased  Mood:  Angry, worried  Affect:  Restricted  Thought Process:  Coherent and Goal Directed  Orientation:  Full (Time, Place, and Person)  Thought Content:  plans as he moves on, relapse prenvention plan  Suicidal Thoughts:  No  Homicidal Thoughts:  No  Memory:  Immediate;   Fair Recent;   Fair Remote;   Fair  Judgement:  Fair  Insight:  Present and Shallow  Psychomotor Activity:  Normal  Concentration:  Fair  Recall:  [employer] of Knowledge:Fair  Language: Fair  Akathisia:  No  Handed:  Right  AIMS (if indicated):     Assets:  Desire for Improvement  Sleep:  Number of Hours: 5  Cognition: WNL  ADL's:  Intact   Have you used any form of tobacco in the last 30 days? (Cigarettes, Smokeless Tobacco, Cigars, and/or Pipes): Yes  Has this patient used any form of tobacco in the last 30 days? (Cigarettes, Smokeless Tobacco, Cigars, and/or Pipes) Yes, Prescription not provided because: nicotine patches will be given  Mental Status Per Nursing Assessment::   On Admission:  Suicidal ideation indicated by others  Current Mental Status by Physician: In full contact with reality. There are no active S/S of withdrawal. There are no active SI plans or intent. He is willing and motivated to pursue residential treatment   Loss Factors: NA  Historical Factors: NA  Risk Reduction Factors:   wants to do  better  Continued Clinical Symptoms:  Alcohol/Substance Abuse/Dependencies  Cognitive Features That Contribute To Risk:  Closed-mindedness, Polarized thinking and Thought constriction (tunnel vision)    Suicide Risk:  Minimal: No identifiable suicidal ideation.  Patients presenting with no risk factors but with morbid ruminations; may be classified as minimal risk based on the severity of the depressive symptoms  Principal Problem: Substance induced mood disorder Discharge Diagnoses:  Patient Active Problem List   Diagnosis Date Noted  . Substance induced mood disorder [F19.94] 03/02/2015  . Alcohol dependence [F10.20] 03/02/2015  . Cocaine dependence [F14.20] 03/02/2015    Follow-up Information    Follow up with [facility].   Why:  Walk in between 8am-9am Monday through Friday for hospital follow-up/medication management/assessment for mental health services.    Contact information:   [address], [place] [address] Phone: [phone] Fax: [phone]      Follow up with [facility]  On 03/09/2015.   Why:  Screeing for possible admission on this date at 8:00AM. Make sure to bring photo ID/proof of [place] residency and Medicare card.    Contact information:   [address] [place], [place] [address] Phone: [phone] Fax: [phone]      Follow up with [facility].   Why:  Referral sent: 3:30PM on 03/04/15. Please follow-up with them after discharge if still interested in this program.    Contact information:   [address] [facility] [address] [place], [place]  [address] Phone: [phone] Fax: [phone]      Plan Of Care/Follow-up recommendations:  Activity:  as tolerated Diet:  regular  Follow up [facility] as above/[facility] as above  Is patient on multiple antipsychotic therapies at discharge:  No   Has Patient had three or more failed trials of antipsychotic monotherapy by history:  No  Recommended  Plan for Multiple Antipsychotic Therapies: NA    Ryan Reid A 03/08/2015, 6:43 PM
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
## 2015-03-08 NOTE — [facility] Group Notes (Signed)
[facility] LCSW Group Therapy  03/08/2015 1:14 PM  Type of Therapy:  Group Therapy  Participation Level:  Did Not Attend-pt chose to remain in room to sleep.   Modes of Intervention:  Confrontation, Discussion, Education, Exploration, Problem-solving, Rapport Building, Socialization and Support  Summary of Progress/Problems: Today's Topic: Overcoming Obstacles. Patients identified one short term goal and potential obstacles in reaching this goal. Patients processed barriers involved in overcoming these obstacles. Patients identified steps necessary for overcoming these obstacles and explored motivation (internal and external) for facing these difficulties head on.   Smart, [employer] LCSWA 03/08/2015, 1:14 PM

## 2015-03-08 NOTE — Plan of Care (Signed)
Problem: Alteration in mood Goal: LTG-Patient reports reduction in suicidal thoughts (Patient reports reduction in suicidal thoughts and is able to verbalize a safety plan for whenever patient is feeling suicidal)  Outcome: Adequate for Discharge Patient denies SI.  He plans to go to [facility] tomorrow for further inpatient treatment.

## 2015-03-08 NOTE — Progress Notes (Addendum)
  [facility] [place] Adult Case Management Discharge Plan :  Will you be returning to the same living situation after discharge:  No. Pt has screening for possible admission at [facility] Tuesday at 8am.  At discharge, do you have transportation home?: taxi voucher-approved by [facility] Minerva Areola K) Do you have the ability to pay for your medications: Yes,  medicare  Release of information consent forms completed and submitted to medical records by CSW.  Patient to Follow up at: Follow-up Information    Follow up with [facility].   Why:  Walk in between 8am-9am Monday through Friday for hospital follow-up/medication management/assessment for mental health services.    Contact information:   [address], [place] [address] Phone: [phone] Fax: [phone]      Follow up with [facility]  On 03/09/2015.   Why:  Screeing for possible admission on this date at 8:00AM. Make sure to bring photo ID/proof of [place] residency and Medicare card.    Contact information:   [address] [place], [place] [address] Phone: [phone] Fax: [phone]      Follow up with [facility].   Why:  Referral sent: 3:30PM on 03/04/15. Please follow-up with them after discharge if still interested in this program.    Contact information:   [address] [place], [place] [address] Phone: [phone] Fax: [phone]      Patient denies SI/HI: Yes,  during group/self report.     Safety Planning and Suicide Prevention discussed: Yes,  SPE completed with pt, as he refused to consent to family contact.  Have you used any form of tobacco in the last 30 days? (Cigarettes, Smokeless Tobacco, Cigars, and/or Pipes): Yes  Has patient been referred to the Quitline?: Patient refused referral  Smart, Lebron Quam  03/08/2015, 11:47 AM

## 2015-03-09 NOTE — Progress Notes (Signed)
Pt was discharged this morning to go to [facility] for a screening.  He understands that he may not get a bed at this time.  He reports he is ready for discharge and is not having any withdrawal symptoms.  Discharge instructions were reviewed with the patient and all paperwork signed.  Pt was given his two week supply of medications and all of his belongings from locker #26 were returned to the patient.  Pt voiced no needs or concerns.  He was provided a breakfast on discharge.  Pt safe at this time.

## 2015-03-09 NOTE — Progress Notes (Signed)
Pt reports he is doing fine and is to be discharged in the morning for a screening at [facility].  Pt denies SI/HI/AVH.  He denies any withdrawal symptoms.  Pt voiced on needs or concerns at this time.  Pt states he has been attending groups.  Support and encouragement offered.  Safety maintained with q15 minute checks.

## 2015-03-13 ENCOUNTER — Encounter (HOSPITAL_COMMUNITY): Payer: Self-pay

## 2015-03-13 ENCOUNTER — Emergency Department (HOSPITAL_COMMUNITY)
Admission: EM | Admit: 2015-03-13 | Discharge: 2015-03-15 | Disposition: A | Discharge status: Other Acute Inpatient Hospital | Payer: Medicare Other | Attending: Emergency Medicine | Admitting: Emergency Medicine

## 2015-03-13 DIAGNOSIS — F10120 Alcohol abuse with intoxication, uncomplicated: Secondary | ICD-10-CM | POA: Insufficient documentation

## 2015-03-13 DIAGNOSIS — F10920 Alcohol use, unspecified with intoxication, uncomplicated: Secondary | ICD-10-CM

## 2015-03-13 DIAGNOSIS — Z590 Homelessness: Secondary | ICD-10-CM | POA: Insufficient documentation

## 2015-03-13 DIAGNOSIS — Z720 Tobacco use: Secondary | ICD-10-CM | POA: Diagnosis not present

## 2015-03-13 DIAGNOSIS — Z79899 Other long term (current) drug therapy: Secondary | ICD-10-CM | POA: Insufficient documentation

## 2015-03-13 DIAGNOSIS — R45851 Suicidal ideations: Secondary | ICD-10-CM

## 2015-03-13 DIAGNOSIS — R45850 Homicidal ideations: Secondary | ICD-10-CM

## 2015-03-13 HISTORY — DX: Personal history of self-harm: Z91.5

## 2015-03-13 HISTORY — DX: Homelessness unspecified: Z59.00

## 2015-03-13 HISTORY — DX: Personal history of suicidal behavior: Z91.51

## 2015-03-13 HISTORY — DX: Homelessness: Z59.0

## 2015-03-13 LAB — CBC
HCT: 38.7 % — ABNORMAL LOW (ref 39.0–52.0)
Hemoglobin: 13.3 g/dL (ref 13.0–17.0)
MCH: 32 pg (ref 26.0–34.0)
MCHC: 34.4 g/dL (ref 30.0–36.0)
MCV: 93.3 fL (ref 78.0–100.0)
PLATELETS: 254 10*3/uL (ref 150–400)
RBC: 4.15 MIL/uL — ABNORMAL LOW (ref 4.22–5.81)
RDW: 13.3 % (ref 11.5–15.5)
WBC: 7.9 10*3/uL (ref 4.0–10.5)

## 2015-03-13 NOTE — ED Provider Notes (Signed)
CSN: [id number]     Arrival date & time 03/13/15  2251 History  This chart was scribed for Kerrie Buffalo, NP working with Tomasita Crumble, MD by Evon Slack, ED Scribe. This patient was seen in room B16C/B16C and the patient's care was started at 11:34 PM.      Chief Complaint  Patient presents with  . Suicidal  . Alcohol Intoxication    Patient is a 64 y.o. male presenting with intoxication and mental health disorder. The history is provided by the patient. No language interpreter was used.  Alcohol Intoxication This is a chronic problem. Pertinent negatives include no chest pain and no abdominal pain. He has tried nothing for the symptoms.  Mental Health Problem Presenting symptoms: depression, hallucinations, homicidal ideas and suicidal thoughts   Timing:  Constant Context: alcohol use   Context: not medication   Treatment compliance:  Untreated Relieved by:  Nothing Worsened by:  Alcohol Ineffective treatments:  None tried Associated symptoms: no abdominal pain and no chest pain    HPI Comments: Ryan Reid is a 64 y.o. male who presents to the Emergency Department for SI, HI and alcohol intoxication. Pt states that he and his wife got problems and he wants to kill her and her new man. He states that the new man was in his house with the patient's wife. He states that he also doesn't want anyone around his son. He states that he planned on killing his wife's new man but tieing him up with a wire then plugging it up to the wall and shocking him to death. He states that he has several other plans if that doesn't work. Pt admits to auditory hallucinations that tell him what he can do. Pt admits to alcohol use tonight. Pt states that he has only had two 40 ounces today. Pt states that he drinks alcohol everyday. he states that he mostly drinks beer and occasionally liquor. He states that he drinks continuously throughout the day. Pt states that he has a Hx of depression, alcohol abuse. Pt  states that he has been to rehab previously and does not wish to return to rehab at this time. Pt denies drug use tonight. Pt denies owning a gun. PT denies CP or abdominal pain. Pt later states that he is homeless and his wife states in [place]. Patient is here in the ED alone.  The patient has been a patient in [facility] recently.  Past Medical History  Diagnosis Date  . Homeless    History reviewed. No pertinent past surgical history. No family history on file. Social History  Substance Use Topics  . Smoking status: Current Every Day Smoker  . Smokeless tobacco: None  . Alcohol Use: Yes     Comment: 1 beer a day    Review of Systems  Cardiovascular: Negative for chest pain.  Gastrointestinal: Negative for abdominal pain.  Psychiatric/Behavioral: Positive for suicidal ideas, homicidal ideas and hallucinations.  All other systems reviewed and are negative.    Allergies  Lactose intolerance (gi)  Home Medications   Prior to Admission medications   Medication Sig Start Date End Date Taking? Authorizing Provider  traZODone (DESYREL) 100 MG tablet Take 1 tablet (100 mg total) by mouth at bedtime and may repeat dose one time if needed. For sleep 03/08/15  Yes Sanjuana Kava, NP  hydrOXYzine (ATARAX/VISTARIL) 25 MG tablet Take 1 tablet (25 mg total) by mouth every 6 (six) hours as needed for anxiety. 03/08/15   Nelda Marseille  Nwoko, NP  nicotine (NICODERM CQ - DOSED IN MG/24 HOURS) 21 mg/24hr patch Place 1 patch (21 mg total) onto the skin daily. For smoking cessation 03/08/15   Sanjuana Kava, NP   BP 135/81 mmHg  Pulse 102  Temp(Src) 97.8 F (36.6 C) (Oral)  Resp 16  SpO2 96%   Physical Exam  Constitutional: He is oriented to person, place, and time. No distress.  Thin A/A male  HENT:  Head: Normocephalic and atraumatic.  Nose: Nose normal.  Mouth/Throat: Uvula is midline, oropharynx is clear and moist and mucous membranes are normal.  Eyes: Conjunctivae and EOM are  normal.  Neck: Neck supple. No tracheal deviation present.  Cardiovascular: Normal rate, regular rhythm and normal heart sounds.   Pulses:      Radial pulses are 2+ on the right side, and 2+ on the left side.  Pulmonary/Chest: Effort normal and breath sounds normal. No respiratory distress.  Abdominal: Soft. Bowel sounds are normal. There is no tenderness.  Musculoskeletal: Normal range of motion.  No lower extremity edema.   Neurological: He is alert and oriented to person, place, and time.  Equal grip strength.   Skin: Skin is warm and dry.  Psychiatric: He has a normal mood and affect. His behavior is normal.  Nursing note and vitals reviewed.   ED Course  Procedures (including critical care time) DIAGNOSTIC STUDIES: Oxygen Saturation is 96% on RA, normal by my interpretation.    COORDINATION OF CARE: 11:44 PM-Discussed treatment plan with pt at bedside and pt agreed to plan.    Results for orders placed or performed during the hospital encounter of 03/13/15 (from the past 24 hour(s))  Comprehensive metabolic panel     Status: Abnormal   Collection Time: 03/13/15 11:30 PM  Result Value Ref Range   Sodium 135 135 - 145 mmol/L   Potassium 3.4 (L) 3.5 - 5.1 mmol/L   Chloride 104 101 - 111 mmol/L   CO2 21 (L) 22 - 32 mmol/L   Glucose, Bld 93 65 - 99 mg/dL   BUN 14 6 - 20 mg/dL   Creatinine, Ser 1.61 0.61 - 1.24 mg/dL   Calcium 9.2 8.9 - 09.6 mg/dL   Total Protein 7.6 6.5 - 8.1 g/dL   Albumin 3.9 3.5 - 5.0 g/dL   AST 24 15 - 41 U/L   ALT 27 17 - 63 U/L   Alkaline Phosphatase 56 38 - 126 U/L   Total Bilirubin 0.4 0.3 - 1.2 mg/dL   GFR calc non Af Amer >60 >60 mL/min   GFR calc Af Amer >60 >60 mL/min   Anion gap 10 5 - 15  Ethanol (ETOH)     Status: Abnormal   Collection Time: 03/13/15 11:30 PM  Result Value Ref Range   Alcohol, Ethyl (B) 164 (H) <5 mg/dL  Salicylate level     Status: None   Collection Time: 03/13/15 11:30 PM  Result Value Ref Range   Salicylate Lvl  <4.0 2.8 - 30.0 mg/dL  Acetaminophen level     Status: Abnormal   Collection Time: 03/13/15 11:30 PM  Result Value Ref Range   Acetaminophen (Tylenol), Serum <10 (L) 10 - 30 ug/mL  CBC     Status: Abnormal   Collection Time: 03/13/15 11:30 PM  Result Value Ref Range   WBC 7.9 4.0 - 10.5 K/uL   RBC 4.15 (L) 4.22 - 5.81 MIL/uL   Hemoglobin 13.3 13.0 - 17.0 g/dL   HCT 04.5 (L) 40.9 -  52.0 %   MCV 93.3 78.0 - 100.0 fL   MCH 32.0 26.0 - 34.0 pg   MCHC 34.4 30.0 - 36.0 g/dL   RDW 62.1 30.8 - 65.7 %   Platelets 254 150 - 400 K/uL     Imaging Review No results found. I have personally reviewed and evaluated the lab results as part of my medical decision-making.  Consult with Behavioral Health and they will evaluate the patient for possible admission.  MDM  64 y.o. male here in the ED stating that he is going to kill himself and two other people. He has been a patient in [facility] recently and they will evaluate the patient for possible readmission.   Final diagnoses:  Suicidal ideation  Homicidal ideation  Alcohol intoxication, uncomplicated   Dr. Tomasita Crumble informed and will take over care of the patient.   I personally performed the services described in this documentation, which was scribed in my presence. The recorded information has been reviewed and is accurate.    [address] [place], [place] 03/14/15 8469  Tomasita Crumble, MD 03/14/15 [phone]

## 2015-03-13 NOTE — ED Notes (Signed)
Pt drank two 40's today and is tired of everything. States he wants to kill himself. Has a plan but will not tell this RN what his plan is. Denies having access to guns. States he has access to a few pills. Will not elaborate. Pt is confused d/t intoxication at this time. Pt is homeless. Does have relatives but does not associate with them any longer.

## 2015-03-13 NOTE — ED Notes (Signed)
Staffing called for sitter and security in triage wanding pt at this time.

## 2015-03-13 NOTE — ED Notes (Signed)
Pt. wanded by security at triage . Clothes and shoes bagged and labelled.

## 2015-03-14 ENCOUNTER — Encounter (HOSPITAL_COMMUNITY): Payer: Self-pay | Admitting: *Deleted

## 2015-03-14 LAB — COMPREHENSIVE METABOLIC PANEL
ALBUMIN: 3.9 g/dL (ref 3.5–5.0)
ALK PHOS: 56 U/L (ref 38–126)
ALT: 27 U/L (ref 17–63)
AST: 24 U/L (ref 15–41)
Anion gap: 10 (ref 5–15)
BILIRUBIN TOTAL: 0.4 mg/dL (ref 0.3–1.2)
BUN: 14 mg/dL (ref 6–20)
CALCIUM: 9.2 mg/dL (ref 8.9–10.3)
CO2: 21 mmol/L — AB (ref 22–32)
CREATININE: 0.82 mg/dL (ref 0.61–1.24)
Chloride: 104 mmol/L (ref 101–111)
GFR calc Af Amer: >60 mL/min (ref >60)
GFR calc non Af Amer: >60 mL/min (ref >60)
GLUCOSE: 93 mg/dL (ref 65–99)
Potassium: 3.4 mmol/L — ABNORMAL LOW (ref 3.5–5.1)
Sodium: 135 mmol/L (ref 135–145)
TOTAL PROTEIN: 7.6 g/dL (ref 6.5–8.1)

## 2015-03-14 LAB — RAPID URINE DRUG SCREEN, HOSP PERFORMED
AMPHETAMINES: NOT DETECTED
BENZODIAZEPINES: NOT DETECTED
Barbiturates: NOT DETECTED
Cocaine: NOT DETECTED
Opiates: NOT DETECTED
TETRAHYDROCANNABINOL: NOT DETECTED

## 2015-03-14 LAB — SALICYLATE LEVEL: Salicylate Lvl: <4 mg/dL (ref 2.8–30.0)

## 2015-03-14 LAB — ETHANOL: Alcohol, Ethyl (B): 164 mg/dL — ABNORMAL HIGH (ref <5)

## 2015-03-14 LAB — ACETAMINOPHEN LEVEL: Acetaminophen (Tylenol), Serum: <10 ug/mL — ABNORMAL LOW (ref 10–30)

## 2015-03-14 MED ORDER — ONDANSETRON HCL 4 MG PO TABS: 4.0000 mg | ORAL_TABLET | Freq: Three times a day (TID) | ORAL | Status: DC | PRN

## 2015-03-14 MED ORDER — NICOTINE 21 MG/24HR TD PT24: 21.0000 mg | MEDICATED_PATCH | Freq: Every day | TRANSDERMAL | Status: DC

## 2015-03-14 MED ORDER — LORAZEPAM 1 MG PO TABS: 1.0000 mg | ORAL_TABLET | Freq: Three times a day (TID) | ORAL | Status: DC | PRN

## 2015-03-14 MED ORDER — IBUPROFEN 400 MG PO TABS: 600.0000 mg | ORAL_TABLET | Freq: Three times a day (TID) | ORAL | Status: DC | PRN

## 2015-03-14 MED ORDER — ACETAMINOPHEN 325 MG PO TABS: 650.0000 mg | ORAL_TABLET | ORAL | Status: DC | PRN

## 2015-03-14 MED ORDER — LORAZEPAM 1 MG PO TABS: 0.0000 mg | ORAL_TABLET | Freq: Two times a day (BID) | ORAL | Status: DC

## 2015-03-14 MED ORDER — ALUM & MAG HYDROXIDE-SIMETH 200-200-20 MG/5ML PO SUSP
30.0000 mL | ORAL | Status: DC | PRN
Start: 2015-03-14 — End: 2015-03-15

## 2015-03-14 MED ORDER — LORAZEPAM 1 MG PO TABS: 0.0000 mg | ORAL_TABLET | Freq: Four times a day (QID) | ORAL | Status: DC

## 2015-03-14 NOTE — ED Notes (Signed)
Patient resting on stretcher watching television; sitter at bedside

## 2015-03-14 NOTE — ED Notes (Signed)
Patient is eating lunch tray; sitter at bedside

## 2015-03-14 NOTE — BH Assessment (Signed)
Received notification of TTS consult request. Spoke to [facility], NP who said Pt reports depression, suicidal ideation and homicidal ideation and psychosis. Tele-assessment will be initiated.  Harlin Rain Patsy [place], LPC, [facility], [facility] Triage Specialist [phone]

## 2015-03-14 NOTE — ED Notes (Signed)
Lunch order taken and sent; sitter at bedside 

## 2015-03-14 NOTE — ED Notes (Signed)
Patient appears to be asleep; sitter at bedside 

## 2015-03-14 NOTE — ED Notes (Signed)
Dinner order taken and sent; sitter at bedside 

## 2015-03-14 NOTE — BH Assessment (Signed)
Contacted the following facilities for placement:  AT [facility]: [facility], per R[facility], per Danny [facility], per Andrea [facility], per Kay[facility], per [facility], per Nancy [facility], per [facility], per [facility], per [facility], per [facility], per Tina [facility], per lisa [facility], per Dave [facility], per [facility], per Bernadine [facility], per [facility], per [facility], per Angie [facility], per Andrea [facility], per [facility], per Lois [facility], per Mary   Ryan Reid Ellis Sentoria Brent Jr, LPC, NCC, [facility] Triage Specialist [phone] 
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
## 2015-03-14 NOTE — ED Notes (Signed)
Snacks given with a cup of orange juice; sitter at bedside

## 2015-03-14 NOTE — ED Notes (Signed)
Dinner tray arrived, patient eating; sitter at bedside 

## 2015-03-14 NOTE — BH Assessment (Addendum)
Ryan Reid, [facility], confirms adult unit is currently at capacity. Contacted the following facilities for placement:  BED AVAILABLE, FAXED CLINICAL INFORMATION: [name], per [name], per [name], per [name], per [name]  AT CAPACITY: [name], per [name], per [name], per [name], per [name], per [name], per [name], per [name], [name], [name], [name] Triage Specialist [phone]

## 2015-03-14 NOTE — ED Notes (Addendum)
Resting quietly at this time.

## 2015-03-14 NOTE — ED Notes (Signed)
Patient up ambulatory to the bathroom to shower and get changed into clean scrubs; stretcher linens changed; sitter at door

## 2015-03-14 NOTE — ED Notes (Signed)
Pt was given graham crackers and a coke with ice.

## 2015-03-14 NOTE — ED Notes (Signed)
Sitter at beside

## 2015-03-14 NOTE — ED Notes (Signed)
Moved to pod C.  Incontinent of urine.  Incontinence care given.  Ambulated to bathroom without difficulty.  Steady gait noted.

## 2015-03-14 NOTE — ED Notes (Signed)
Patient resting on stretcher watching television; sitter at bedside 

## 2015-03-15 NOTE — ED Provider Notes (Signed)
64 yo M accepted to [facility], Dr. Roselle Locus. Reevaluation and patient seems medically stable for transfer.    Filed Vitals:   03/15/15 0609  BP: 143/85  Pulse: 69  Temp: 98.3 F (36.8 C)  Resp: 16     Marily Memos, MD 03/15/15 1542

## 2015-03-15 NOTE — ED Notes (Signed)
Pt resting peacefully in stretcher, states his last alcoholic drink was 2 days ago and consisted of one fifth of liquor and 2 40 oz's. No signs of withdrawal noted at current time. Pt is a/o and speaking in clear, complete sentences. Will continue to monitor. Denies SI/HI at this time. Denies visual/auditory hallucinations. Sitter at bedside.

## 2015-03-15 NOTE — ED Notes (Signed)
Pt belongings given to [facility] transporter. Pt ambulatory with steady gait leaving safely with [facility] transporter. A/O. NAD. No signs of alcohol withdrawal noted.

## 2015-03-15 NOTE — ED Notes (Signed)
Pt was given sandwich and coke for snack.

## 2015-03-15 NOTE — ED Notes (Signed)
Ford called from [facility], reported that pt will have a bed at [facility] today after 10am.

## 2015-03-15 NOTE — ED Notes (Signed)
Pt agrees to sign hisself in at [facility]. Consents to wanting treatment. Pt did not want to stay for treatment "if he had to leave his clothes by [employer] [facility]", told pt that Juel Burrow does not take him by his residence to pick up belongings. Pt agreed to transfer and treatment if we could give him some clothing. pt given 3 outfits from clothing storage, packaged in belonging bag and will be transported with pt.

## 2015-03-15 NOTE — ED Notes (Signed)
Breakfast delivered, pt eating w/ sitter at bedside  

## 2015-03-15 NOTE — BH Assessment (Signed)
Received call from [place] at [facility] who said Dr. Roselle Locus has accepted Pt to [facility] after 1000. Nursing report can be called to [phone]. Notified Dr. Loren Racer and Victorino Dike, RN.   Harlin Rain Patsy Baltimore, LPC, [facility], [name] Triage Specialist [phone]

## 2015-03-30 ENCOUNTER — Emergency Department (HOSPITAL_COMMUNITY): Payer: Medicare Other

## 2015-03-30 ENCOUNTER — Encounter (HOSPITAL_COMMUNITY): Payer: Self-pay | Admitting: Emergency Medicine

## 2015-03-30 ENCOUNTER — Inpatient Hospital Stay (HOSPITAL_COMMUNITY)
Admission: EM | Admit: 2015-03-30 | Discharge: 2015-04-01 | DRG: 897 | Disposition: A | Discharge status: Home / Self Care | Payer: Medicare Other | Attending: Internal Medicine | Admitting: Internal Medicine

## 2015-03-30 DIAGNOSIS — F17210 Nicotine dependence, cigarettes, uncomplicated: Secondary | ICD-10-CM | POA: Diagnosis not present

## 2015-03-30 DIAGNOSIS — J439 Emphysema, unspecified: Secondary | ICD-10-CM

## 2015-03-30 DIAGNOSIS — F1424 Cocaine dependence with cocaine-induced mood disorder: Principal | ICD-10-CM | POA: Diagnosis present

## 2015-03-30 DIAGNOSIS — J449 Chronic obstructive pulmonary disease, unspecified: Secondary | ICD-10-CM | POA: Diagnosis not present

## 2015-03-30 DIAGNOSIS — Z7982 Long term (current) use of aspirin: Secondary | ICD-10-CM | POA: Diagnosis not present

## 2015-03-30 DIAGNOSIS — E739 Lactose intolerance, unspecified: Secondary | ICD-10-CM | POA: Diagnosis present

## 2015-03-30 DIAGNOSIS — Z8249 Family history of ischemic heart disease and other diseases of the circulatory system: Secondary | ICD-10-CM | POA: Diagnosis not present

## 2015-03-30 DIAGNOSIS — Z915 Personal history of self-harm: Secondary | ICD-10-CM | POA: Diagnosis not present

## 2015-03-30 DIAGNOSIS — R079 Chest pain, unspecified: Secondary | ICD-10-CM | POA: Diagnosis not present

## 2015-03-30 DIAGNOSIS — F1020 Alcohol dependence, uncomplicated: Secondary | ICD-10-CM | POA: Diagnosis present

## 2015-03-30 DIAGNOSIS — F209 Schizophrenia, unspecified: Secondary | ICD-10-CM | POA: Diagnosis present

## 2015-03-30 DIAGNOSIS — I10 Essential (primary) hypertension: Secondary | ICD-10-CM | POA: Diagnosis present

## 2015-03-30 DIAGNOSIS — F1994 Other psychoactive substance use, unspecified with psychoactive substance-induced mood disorder: Secondary | ICD-10-CM | POA: Diagnosis present

## 2015-03-30 DIAGNOSIS — F1420 Cocaine dependence, uncomplicated: Secondary | ICD-10-CM | POA: Diagnosis present

## 2015-03-30 DIAGNOSIS — Z590 Homelessness: Secondary | ICD-10-CM | POA: Diagnosis not present

## 2015-03-30 DIAGNOSIS — R45851 Suicidal ideations: Secondary | ICD-10-CM

## 2015-03-30 DIAGNOSIS — F10230 Alcohol dependence with withdrawal, uncomplicated: Secondary | ICD-10-CM

## 2015-03-30 HISTORY — DX: Chronic obstructive pulmonary disease, unspecified: J44.9

## 2015-03-30 LAB — BASIC METABOLIC PANEL
Anion gap: 5 (ref 5–15)
BUN: 15 mg/dL (ref 6–20)
CALCIUM: 9.5 mg/dL (ref 8.9–10.3)
CO2: 31 mmol/L (ref 22–32)
CREATININE: 0.92 mg/dL (ref 0.61–1.24)
Chloride: 105 mmol/L (ref 101–111)
GFR calc Af Amer: >60 mL/min (ref >60)
GLUCOSE: 82 mg/dL (ref 65–99)
Potassium: 4.2 mmol/L (ref 3.5–5.1)
Sodium: 141 mmol/L (ref 135–145)

## 2015-03-30 LAB — CBC WITH DIFFERENTIAL/PLATELET
BASOS ABS: 0 10*3/uL (ref 0.0–0.1)
Basophils Relative: 0 % (ref 0–1)
EOS PCT: 4 % (ref 0–5)
Eosinophils Absolute: 0.3 10*3/uL (ref 0.0–0.7)
HEMATOCRIT: 41.4 % (ref 39.0–52.0)
Hemoglobin: 13.9 g/dL (ref 13.0–17.0)
LYMPHS PCT: 41 % (ref 12–46)
Lymphs Abs: 2.8 10*3/uL (ref 0.7–4.0)
MCH: 31.7 pg (ref 26.0–34.0)
MCHC: 33.6 g/dL (ref 30.0–36.0)
MCV: 94.5 fL (ref 78.0–100.0)
MONO ABS: 0.5 10*3/uL (ref 0.1–1.0)
MONOS PCT: 8 % (ref 3–12)
NEUTROS ABS: 3.2 10*3/uL (ref 1.7–7.7)
Neutrophils Relative %: 47 % (ref 43–77)
PLATELETS: 219 10*3/uL (ref 150–400)
RBC: 4.38 MIL/uL (ref 4.22–5.81)
RDW: 12.9 % (ref 11.5–15.5)
WBC: 6.8 10*3/uL (ref 4.0–10.5)

## 2015-03-30 LAB — I-STAT TROPONIN, ED: Troponin i, poc: 0.01 ng/mL (ref 0.00–0.08)

## 2015-03-30 MED ORDER — LORAZEPAM 2 MG/ML IJ SOLN: 1.0000 mg | INTRAMUSCULAR | Status: DC | PRN

## 2015-03-30 MED ORDER — ACETAMINOPHEN 325 MG PO TABS: 650.0000 mg | ORAL_TABLET | Freq: Four times a day (QID) | ORAL | Status: DC | PRN

## 2015-03-30 MED ORDER — ASPIRIN 325 MG PO TABS
325.0000 mg | ORAL_TABLET | Freq: Once | ORAL | Status: AC
Start: 2015-03-30 — End: 2015-03-31
  Administered 2015-03-31: 325 mg via ORAL
  Filled 2015-03-30: qty 1

## 2015-03-30 MED ORDER — NICOTINE 21 MG/24HR TD PT24
21.0000 mg | MEDICATED_PATCH | Freq: Every day | TRANSDERMAL | Status: DC
  Administered 2015-03-31 – 2015-04-01 (×2): 21 mg via TRANSDERMAL
  Filled 2015-03-30 (×2): qty 1

## 2015-03-30 MED ORDER — SODIUM CHLORIDE 0.9 % IV SOLN: 250.0000 mL | INTRAVENOUS | Status: DC | PRN

## 2015-03-30 MED ORDER — TRAZODONE HCL 50 MG PO TABS
100.0000 mg | ORAL_TABLET | Freq: Every evening | ORAL | Status: DC | PRN
  Administered 2015-03-31: 100 mg via ORAL
  Filled 2015-03-30: qty 2

## 2015-03-30 MED ORDER — ONDANSETRON HCL 4 MG/2ML IJ SOLN: 4.0000 mg | Freq: Four times a day (QID) | INTRAMUSCULAR | Status: DC | PRN

## 2015-03-30 MED ORDER — SODIUM CHLORIDE 0.9 % IJ SOLN
3.0000 mL | Freq: Two times a day (BID) | INTRAMUSCULAR | Status: DC
  Administered 2015-03-31 – 2015-04-01 (×4): 3 mL via INTRAVENOUS

## 2015-03-30 MED ORDER — HYDROXYZINE HCL 25 MG PO TABS: 25.0000 mg | ORAL_TABLET | Freq: Four times a day (QID) | ORAL | Status: DC | PRN

## 2015-03-30 MED ORDER — ALUM & MAG HYDROXIDE-SIMETH 200-200-20 MG/5ML PO SUSP: 30.0000 mL | Freq: Four times a day (QID) | ORAL | Status: DC | PRN

## 2015-03-30 MED ORDER — OXYCODONE HCL 5 MG PO TABS: 5.0000 mg | ORAL_TABLET | ORAL | Status: DC | PRN

## 2015-03-30 MED ORDER — HYDROMORPHONE HCL 1 MG/ML IJ SOLN: 0.5000 mg | INTRAMUSCULAR | Status: DC | PRN

## 2015-03-30 MED ORDER — ONDANSETRON HCL 4 MG PO TABS
4.0000 mg | ORAL_TABLET | Freq: Four times a day (QID) | ORAL | Status: DC | PRN
Start: 2015-03-30 — End: 2015-04-01

## 2015-03-30 MED ORDER — CLONIDINE HCL 0.1 MG PO TABS: 0.1000 mg | ORAL_TABLET | Freq: Four times a day (QID) | ORAL | Status: DC | PRN

## 2015-03-30 MED ORDER — SODIUM CHLORIDE 0.9 % IJ SOLN: 3.0000 mL | INTRAMUSCULAR | Status: DC | PRN

## 2015-03-30 MED ORDER — ENOXAPARIN SODIUM 40 MG/0.4ML ~~LOC~~ SOLN
40.0000 mg | Freq: Every day | SUBCUTANEOUS | Status: DC
  Filled 2015-03-30: qty 0.4

## 2015-03-30 MED ORDER — NITROGLYCERIN 2 % TD OINT
1.0000 [in_us] | TOPICAL_OINTMENT | Freq: Four times a day (QID) | TRANSDERMAL | Status: DC
  Administered 2015-03-31: 1 [in_us] via TOPICAL
  Filled 2015-03-30: qty 30

## 2015-03-30 MED ORDER — ACETAMINOPHEN 650 MG RE SUPP: 650.0000 mg | Freq: Four times a day (QID) | RECTAL | Status: DC | PRN

## 2015-03-30 NOTE — ED Notes (Signed)
[facility] due [facility] at night. Pt denies chest pain at this time. [facility] to [facility] due to command hallucinations. Pt states he hears voices telling him to jump off a bridge since his father died last year. Pt denies HI/SI at this time.

## 2015-03-30 NOTE — H&P (Signed)
[facility] Admission History and Physical       Ryan Reid JXB:[id number] DOB: 1951-02-21 DOA: 03/30/2015  Referring physician: EDP PCP: No primary care provider on file.  Specialists:   Chief Complaint: Chest Pain  HPI: Ryan Reid is a 64 y.o. male with a history of Polysubstance abuse and Alcoholism and Schizophrenia who was sent from the [facility] to the ED due to complaints of chest pain.  He reports that he had right sided chest pain which was without radiation and lasted for a few minutes.    It occurred 1 days ago. He had associated SOB, but he denies having any nausea vomiting or diaphoresis. He report that his brother died of a heart attack in his 46's.    He had been admitted to the [facility] this evening due to Auditory hallucinations that were telling him to jump off a bridge. He was involuntarily committed to [facility] on 09/13.     Review of Systems:  Constitutional: No Weight Loss, No Weight Gain, Night Sweats, Fevers, Chills, Dizziness, Light Headedness, Fatigue, or Generalized Weakness HEENT: No Headaches, Difficulty Swallowing,Tooth/Dental Problems,Sore Throat,  No Sneezing, Rhinitis, Ear Ache, Nasal Congestion, or Post Nasal Drip,  Cardio-vascular:  +Chest pain, Orthopnea, PND, Edema in Lower Extremities, Anasarca, Dizziness, Palpitations  Resp: No Dyspnea, No DOE, No Productive Cough, No Non-Productive Cough, No Hemoptysis, No Wheezing.    GI: No Heartburn, Indigestion, Abdominal Pain, Nausea, Vomiting, Diarrhea, Constipation, Hematemesis, Hematochezia, Melena, Change in Bowel Habits,  Loss of Appetite  GU: No Dysuria, No Change in Color of Urine, No Urgency or Urinary Frequency, No Flank pain.  Musculoskeletal: No Joint Pain or Swelling, No Decreased Range of Motion, No Back Pain.  Neurologic: No Syncope, No Seizures, Muscle Weakness, Paresthesia, Vision Disturbance or Loss, No Diplopia, No Vertigo, No Difficulty Walking,  Skin: No Rash or  Lesions. Psych: No Change in Mood or Affect, No Depression or Anxiety, No Memory loss, No Confusion, or Hallucinations   Past Medical History  Diagnosis Date  . Homeless   . Hypertension   . H/O suicide attempt     reports stabbed self  . H/O suicide attempt     reports jumped from tower  . COPD (chronic obstructive pulmonary disease)      History reviewed. No pertinent past surgical history.    Prior to Admission medications   Medication Sig Start Date End Date Taking? Authorizing Provider  hydrOXYzine (ATARAX/VISTARIL) 25 MG tablet Take 1 tablet (25 mg total) by mouth every 6 (six) hours as needed for anxiety. 03/08/15  Yes Sanjuana Kava, NP  nicotine (NICODERM CQ - DOSED IN MG/24 HOURS) 21 mg/24hr patch Place 1 patch (21 mg total) onto the skin daily. For smoking cessation 03/08/15  Yes Sanjuana Kava, NP  traZODone (DESYREL) 100 MG tablet Take 1 tablet (100 mg total) by mouth at bedtime and may repeat dose one time if needed. For sleep 03/08/15  Yes Sanjuana Kava, NP     Allergies  Allergen Reactions  . Lactose Intolerance (Gi) Nausea And Vomiting    Social History:  reports that he has been smoking.  He does not have any smokeless tobacco history on file. He reports that he drinks alcohol. He reports that he uses illicit drugs (Cocaine).    No family history on file.     Physical Exam:  GEN:  Pleasant  64 y.o. male examined and in no acute distress; cooperative with exam Filed Vitals:   03/30/15 1917  03/30/15 1920 03/30/15 2019 03/30/15 2248  BP: 139/85  140/78 156/92  Pulse: 68  61 60  Temp: 98.1 F (36.7 C)   98.4 F (36.9 C)  TempSrc: Oral   Oral  Resp: 16  18 18   Height: 5\' 10"  (1.778 m)   5\' 10"  (1.778 m)  Weight: 65.772 kg (145 lb)   67.495 kg (148 lb 12.8 oz)  SpO2: 100% 100% 100% 100%   Blood pressure 156/92, pulse 60, temperature 98.4 F (36.9 C), temperature source Oral, resp. rate 18, height 5\' 10"  (1.778 m), weight 67.495 kg (148 lb 12.8 oz), SpO2  100 %. PSYCH: SHe is alert and oriented x4; does not appear anxious does not appear depressed; affect is normal HEENT: Normocephalic and Atraumatic, Mucous membranes pink; PERRLA; EOM intact; Fundi:  Benign;  No scleral icterus, Nares: Patent, Oropharynx: Clear, Edentulous or Fair Dentition,    Neck:  FROM, No Cervical Lymphadenopathy nor Thyromegaly or Carotid Bruit; No JVD; Breasts:: Not examined CHEST WALL: No tenderness CHEST: Normal respiration, clear to auscultation bilaterally HEART: Regular rate and rhythm; no murmurs rubs or gallops BACK: No kyphosis or scoliosis; No CVA tenderness ABDOMEN: Positive Bowel Sounds, Scaphoid, Obese, Soft Non-Tender, No Rebound or Guarding; No Masses, No Organomegaly, No Pannus; No Intertriginous candida. Rectal Exam: Not done EXTREMITIES: No Bone or Joint Deformity; Age-Appropriate Arthropathy of the Hands and knees; No Cyanosis, Clubbing, or Edema; No Ulcerations. Genitalia: not examined PULSES: 2+ and symmetric SKIN: Normal hydration no rash or ulceration CNS:  Alert and Oriented x 4, No Focal Deficits Vascular: pulses palpable throughout    Labs on Admission:  Basic Metabolic Panel:  Recent Labs Lab 03/30/15 1952  NA 141  K 4.2  CL 105  CO2 31  GLUCOSE 82  BUN 15  CREATININE 0.92  CALCIUM 9.5   Liver Function Tests: No results for input(s): AST, ALT, ALKPHOS, BILITOT, PROT, ALBUMIN in the last 168 hours. No results for input(s): LIPASE, AMYLASE in the last 168 hours. No results for input(s): AMMONIA in the last 168 hours. CBC:  Recent Labs Lab 03/30/15 1952  WBC 6.8  NEUTROABS 3.2  HGB 13.9  HCT 41.4  MCV 94.5  PLT 219   Cardiac Enzymes: No results for input(s): CKTOTAL, CKMB, CKMBINDEX, TROPONINI in the last 168 hours.  BNP (last 3 results) No results for input(s): BNP in the last 8760 hours.  ProBNP (last 3 results) No results for input(s): PROBNP in the last 8760 hours.  CBG: No results for input(s): GLUCAP in  the last 168 hours.  Radiological Exams on Admission: Dg Chest 2 View  03/30/2015   CLINICAL DATA:  64 year old male with fever and cough  EXAM: CHEST  2 VIEW  COMPARISON:  Radiograph dated 04/09/2014  FINDINGS: Two views of the chest demonstrate emphysematous changes of the lungs. No focal consolidation, pleural effusion, or pneumothorax. The cardiac silhouette is within normal limits. The osseous structures are grossly unremarkable.  IMPRESSION: No active cardiopulmonary disease.   Electronically Signed   By: Elgie Collard M.D.   On: 03/30/2015 19:58     EKG: Independently reviewed. Normal Sinus Rhythm rate =    72 No acute S-T Changes   Assessment/Plan:   64 y.o. male with  Principal Problem:   1.     Chest pain   Cardiac Monitoring   Cycle Troponins   Nitropaste, O2 ASA      Active Problems: 2.       Alcohol Abuse   CIWA  Protocol with IV Ativan       3.    Hypertension   Clonidine PRN SBP > 160     4.    COPD (chronic obstructive pulmonary disease)   DuoNebs   Monitor O2 sats     5..   Suicidal ideation   IVC papers Obtained   1:1 Sitter at Bedside     6.    Schizophrenia   Behavioral Health/Psych  Consult for Medication Adjustments     7.    DVT Prophylaxis   Lovenox    Code Status:     FULL CODE       Family Communication:    No Family Present    Disposition Plan:    Observation Status        Time spent:  67 70 Minutes      Ashiya Kinkead C [facility] Pager [phone]   If 7AM -7PM Please Contact the Day Rounding Team MD for [facility]  If 7PM-7AM, Please Contact Night-Floor Coverage  [email or web address] Password [facility] 03/30/2015, 11:04 PM     ADDENDUM:   Patient was seen and examined on 03/30/2015

## 2015-03-30 NOTE — ED Provider Notes (Signed)
CSN: [id number]     Arrival date & time 03/30/15  1827 History   First MD Initiated Contact with Patient 03/30/15 1907     Chief Complaint  Patient presents with  . Chest Pain  . Medical Clearance     (Consider location/radiation/quality/duration/timing/severity/associated sxs/prior Treatment) The history is provided by the patient. No language interpreter was used.  Ryan Reid is a 64 y.o male with a history of stroke, hypertension, suicidal/homicidal ideation and alcohol intoxication who presents from behavioral health for intermittent right sided chest pain that began last night and lasting for several minutes. He denies any radiation of the pain. He also admits to shortness of breath that is worse with ambulation. He states that he was worried since his brother had a heart attack at age of 65. Nothing makes his symptoms better or worse. He takes 81 mg of aspirin daily and took his dose today. He denies taking any hypertension medication. He is chest pain-free now. He denies any cardiac history. He smokes 2 packs per day. He admits to heavy alcohol use including beer and liquor. He denies any fever, chills, diaphoresis, abdominal pain, nausea, vomiting, diarrhea, increased leg swelling. He denies any SI, HI, or hallucinations now.   Past Medical History  Diagnosis Date  . Homeless   . Hypertension   . H/O suicide attempt     reports stabbed self  . H/O suicide attempt     reports jumped from tower  . COPD (chronic obstructive pulmonary disease)    History reviewed. No pertinent past surgical history. No family history on file. Social History  Substance Use Topics  . Smoking status: Current Every Day Smoker -- 2.00 packs/day  . Smokeless tobacco: None  . Alcohol Use: Yes     Comment: 1 beer a day    Review of Systems  Constitutional: Negative for fever.  Respiratory: Positive for shortness of breath. Negative for cough.   Cardiovascular: Positive for chest pain. Negative for  leg swelling.  Gastrointestinal: Negative for nausea, vomiting and abdominal pain.  All other systems reviewed and are negative.     Allergies  Lactose intolerance (gi)  Home Medications   Prior to Admission medications   Medication Sig Start Date End Date Taking? Authorizing Provider  hydrOXYzine (ATARAX/VISTARIL) 25 MG tablet Take 1 tablet (25 mg total) by mouth every 6 (six) hours as needed for anxiety. 03/08/15  Yes Ryan Kava, NP  nicotine (NICODERM CQ - DOSED IN MG/24 HOURS) 21 mg/24hr patch Place 1 patch (21 mg total) onto the skin daily. For smoking cessation 03/08/15  Yes Ryan Kava, NP  traZODone (DESYREL) 100 MG tablet Take 1 tablet (100 mg total) by mouth at bedtime and may repeat dose one time if needed. For sleep 03/08/15  Yes Ryan Kava, NP   BP 140/78 mmHg  Pulse 61  Temp(Src) 98.1 F (36.7 C) (Oral)  Resp 18  Ht 5\' 10"  (1.778 m)  Wt 145 lb (65.772 kg)  BMI 20.81 kg/m2  SpO2 100% Physical Exam  Constitutional: He is oriented to person, place, and time. He appears well-developed and well-nourished. No distress.  HENT:  Head: Normocephalic and atraumatic.  Eyes: Conjunctivae are normal.  Neck: Normal range of motion. Neck supple.  Cardiovascular: Normal rate, regular rhythm and normal heart sounds.   Pulmonary/Chest: Effort normal and breath sounds normal. No accessory muscle usage. No respiratory distress. He has no decreased breath sounds. He has no wheezes. He has no rales.  Lungs  clear to auscultation bilaterally. No wheezing or decreased breath sounds.  No chest tenderness to palpation.  Abdominal: Soft. He exhibits no distension. There is no tenderness. There is no rebound and no guarding.  Abdomen soft nontender.  Musculoskeletal: Normal range of motion.  No lower extremity edema. Distal pulses intact.  Neurological: He is alert and oriented to person, place, and time.  Skin: Skin is warm and dry.  Psychiatric: He has a normal mood and affect.  His behavior is normal.  Nursing note and vitals reviewed.   ED Course  Procedures (including critical care time) Labs Review Labs Reviewed  CBC WITH DIFFERENTIAL/PLATELET  BASIC METABOLIC PANEL  Rosezena Sensor, ED    Imaging Review Dg Chest 2 View  03/30/2015   CLINICAL DATA:  64 year old male with fever and cough  EXAM: CHEST  2 VIEW  COMPARISON:  Radiograph dated 04/09/2014  FINDINGS: Two views of the chest demonstrate emphysematous changes of the lungs. No focal consolidation, pleural effusion, or pneumothorax. The cardiac silhouette is within normal limits. The osseous structures are grossly unremarkable.  IMPRESSION: No active cardiopulmonary disease.   Electronically Signed   By: Elgie Collard M.D.   On: 03/30/2015 19:58   I have personally reviewed and evaluated these images and lab results as part of my medical decision-making.   EKG Interpretation   Date/Time:  Tuesday March 30 2015 18:37:03 EDT Ventricular Rate:  72 PR Interval:  161 QRS Duration: 83 QT Interval:  374 QTC Calculation: 409 R Axis:   50 Text Interpretation:  Sinus rhythm LAE, consider biatrial enlargement RSR'  in V1 or V2, probably normal variant Borderline ST elevation, anterior  leads Abnormal ekg No old tracing to compare Confirmed by MILLER  MD,  BRIAN ([id number]) on 03/30/2015 7:51:26 PM      MDM   Final diagnoses:  Chest pain, unspecified chest pain type   Patient presents from [facility] for chest pain and shortness of breath that began yesterday. He has never been evaluated for chest pain and has multiple risk factors including family history, smoking, and untreated hypertension. He also has EKG findings that are concerning and has no prior EKG to compare. His vitals are stable and his labs are unremarkable.  Chest xray is negative for edema, infiltrate or pneumothorax.  He was originally admitted to [facility] yesterday for SI, HI, and alcohol abuse. His last drink was at the beginning of  the month.  I believe he will need impatient evaluation for chest pain. He has had no cardiac workup, does not have a primary care physician, and has not been evaluated in several years.  I spoke to the hospitalist who will admit to telemetry.     Catha Gosselin, PA-C 03/30/15 2212  Eber Hong, MD 04/01/15 2203

## 2015-03-30 NOTE — Progress Notes (Signed)
CSW was consulted by Nurse CM to speak with patient regarding homelessness.   CSW met with patient at bedside. Patient confirms that he is homeless. Patient states that he stays "anywhere". Patient informed CSW that he has been living anywhere within the [place] area. Patient informed CSW that he does have family who is supportive. However, he states that he does not want to reach out to family.  Patient states that prior to becoming homeless he was living with his son in [place]. Patient states that he completes his ADL's independently and denies falling often.  CSW gave patient information regarding homeless shelters and food pantries.  Willette Brace [phone] ED CSW 03/30/2015 9:44 PM

## 2015-03-30 NOTE — Progress Notes (Addendum)
EDCM spoke to patient at bedside. Patient confirms he does not have a pcp or insurance living in [place].  [facility] provided patient with contact information to [facility], informed patient of services there .  EDCM also provided patient with list of pcps who accept self pay patients, list of discount pharmacies and websites needymeds.org and GoodRX.com for medication assistance, phone number to inquire about the orange card, phone number to inquire about Mediciad, phone number to inquire about the Affordable Care Act, financial resources in the community such as local churches, salvation army, urban ministries, contact information for the [facility] and dental assistance for uninsured patients.  Patient thankful  for resources.  No further EDCM needs at this time.  Patient listed as having Medicare insurance.  Patient reports he sees the doctor, "In the building right before the urban ministries."  [facility] asked patient if he was staying at the urban ministries, patient replied, "I stay everywhere."  Patient is homeless.  [facility] consulted EDSW for homeless issues.    Patient informed EDCM [facility] [place] [facility].  Patient also reports he is aware of the [facility].

## 2015-03-30 NOTE — ED Notes (Signed)
Per patient, he had right sided chest pain with some dizzy spells.  He denies any n/v.  No blurred vision.

## 2015-03-30 NOTE — ED Provider Notes (Signed)
The patient is a 64 year old male, history of schizophrenia as well as substance abuse, presented to [facility] this evening with a complaint of not taking his medications and having auditory hallucinations telling him to jump off a bridge. He was committed, he was also complaining of chest pain, he was sent to the hospital for medical clearance. He reports his last use of cocaine was 2 weeks ago. He states that his chest pain is right-sided, aching, intermittent throughout the day, he has multiple risk factors including hypertension which is untreated, drug use, heavy tobacco use and a family member, a brother who died in his young 97s of a heart attack. On exam the patient has clear heart and lung sounds, strong pulses, no edema, no JVD. EKG is very abnormal, there is no old EKG with which to compare. As the patient is having intermittent fluctuating symptoms he'll need to be admitted to the hospital for a chest pain evaluation and a coronary rule out. The patient is in agreement with this plan.   EKG Interpretation  Date/Time:  Tuesday March 30 2015 18:37:03 EDT Ventricular Rate:  72 PR Interval:  161 QRS Duration: 83 QT Interval:  374 QTC Calculation: 409 R Axis:   50 Text Interpretation:  Sinus rhythm LAE, consider biatrial enlargement RSR' in V1 or V2, probably normal variant Borderline ST elevation, anterior leads Abnormal ekg No old tracing to compare Confirmed by Daemon Dowty  MD, Jammie Troup ([id number]) on 03/30/2015 7:51:26 PM       Medical screening examination/treatment/procedure(s) were conducted as a shared visit with non-physician practitioner(s) and myself.  I personally evaluated the patient during the encounter.  Clinical Impression:   Final diagnoses:  Chest pain, unspecified chest pain type         Eber Hong, MD 04/01/15 2203

## 2015-03-31 ENCOUNTER — Observation Stay (HOSPITAL_BASED_OUTPATIENT_CLINIC_OR_DEPARTMENT_OTHER): Payer: Medicare Other

## 2015-03-31 DIAGNOSIS — I10 Essential (primary) hypertension: Secondary | ICD-10-CM | POA: Diagnosis not present

## 2015-03-31 DIAGNOSIS — R079 Chest pain, unspecified: Secondary | ICD-10-CM

## 2015-03-31 DIAGNOSIS — F10230 Alcohol dependence with withdrawal, uncomplicated: Secondary | ICD-10-CM | POA: Diagnosis not present

## 2015-03-31 DIAGNOSIS — R45851 Suicidal ideations: Secondary | ICD-10-CM

## 2015-03-31 DIAGNOSIS — F209 Schizophrenia, unspecified: Secondary | ICD-10-CM | POA: Diagnosis present

## 2015-03-31 DIAGNOSIS — F1424 Cocaine dependence with cocaine-induced mood disorder: Secondary | ICD-10-CM | POA: Diagnosis not present

## 2015-03-31 LAB — CBC
HEMATOCRIT: 39.7 % (ref 39.0–52.0)
HEMOGLOBIN: 13.1 g/dL (ref 13.0–17.0)
MCH: 31.4 pg (ref 26.0–34.0)
MCHC: 33 g/dL (ref 30.0–36.0)
MCV: 95.2 fL (ref 78.0–100.0)
Platelets: 219 10*3/uL (ref 150–400)
RBC: 4.17 MIL/uL — AB (ref 4.22–5.81)
RDW: 13.1 % (ref 11.5–15.5)
WBC: 6.8 10*3/uL (ref 4.0–10.5)

## 2015-03-31 LAB — BASIC METABOLIC PANEL
ANION GAP: 6 (ref 5–15)
BUN: 16 mg/dL (ref 6–20)
CALCIUM: 9.2 mg/dL (ref 8.9–10.3)
CHLORIDE: 106 mmol/L (ref 101–111)
CO2: 30 mmol/L (ref 22–32)
Creatinine, Ser: 1.03 mg/dL (ref 0.61–1.24)
GFR calc non Af Amer: >60 mL/min (ref >60)
GLUCOSE: 90 mg/dL (ref 65–99)
POTASSIUM: 3.9 mmol/L (ref 3.5–5.1)
Sodium: 142 mmol/L (ref 135–145)

## 2015-03-31 LAB — TROPONIN I: Troponin I: <0.03 ng/mL (ref <0.031)

## 2015-03-31 MED ORDER — LORAZEPAM 2 MG/ML IJ SOLN: 1.0000 mg | Freq: Four times a day (QID) | INTRAMUSCULAR | Status: DC | PRN

## 2015-03-31 MED ORDER — LORAZEPAM 2 MG/ML IJ SOLN: 0.0000 mg | Freq: Two times a day (BID) | INTRAMUSCULAR | Status: DC

## 2015-03-31 MED ORDER — THIAMINE HCL 100 MG/ML IJ SOLN
100.0000 mg | Freq: Every day | INTRAMUSCULAR | Status: DC
  Filled 2015-03-31: qty 2

## 2015-03-31 MED ORDER — FOLIC ACID 1 MG PO TABS
1.0000 mg | ORAL_TABLET | Freq: Every day | ORAL | Status: DC
  Administered 2015-03-31 – 2015-04-01 (×2): 1 mg via ORAL
  Filled 2015-03-31 (×2): qty 1

## 2015-03-31 MED ORDER — ADULT MULTIVITAMIN W/MINERALS CH
1.0000 | ORAL_TABLET | Freq: Every day | ORAL | Status: DC
  Administered 2015-03-31 – 2015-04-01 (×2): 1 via ORAL
  Filled 2015-03-31 (×2): qty 1

## 2015-03-31 MED ORDER — LORAZEPAM 2 MG/ML IJ SOLN: 0.0000 mg | Freq: Four times a day (QID) | INTRAMUSCULAR | Status: DC

## 2015-03-31 MED ORDER — AMLODIPINE BESYLATE 10 MG PO TABS
10.0000 mg | ORAL_TABLET | Freq: Every day | ORAL | Status: DC
  Administered 2015-03-31 – 2015-04-01 (×2): 10 mg via ORAL
  Filled 2015-03-31 (×2): qty 1

## 2015-03-31 MED ORDER — VITAMIN B-1 100 MG PO TABS
100.0000 mg | ORAL_TABLET | Freq: Every day | ORAL | Status: DC
  Administered 2015-03-31 – 2015-04-01 (×2): 100 mg via ORAL
  Filled 2015-03-31 (×2): qty 1

## 2015-03-31 MED ORDER — LORAZEPAM 1 MG PO TABS: 1.0000 mg | ORAL_TABLET | Freq: Four times a day (QID) | ORAL | Status: DC | PRN

## 2015-03-31 NOTE — Progress Notes (Signed)
  Echocardiogram 2D Echocardiogram has been performed.  Arvil Chaco 03/31/2015, 2:29 PM

## 2015-03-31 NOTE — Progress Notes (Signed)
[facility] PROGRESS NOTE  Ryan Reid ZHY:[id number] DOB: 19-Nov-1950 DOA: 03/30/2015 PCP: No primary care provider on file.  Assessment/Plan: 1. Chest Pain.  -Patient presenting with chest pain having atypical features which, appear to be located more in the right upper quadrant. -Troponins were cycled and have remained negative 3 sets. -She is chest pain-free this a.m. -He had a transthoracic echocardiogram performed today which revealed an ejection fraction of 60-65% without wall motion abnormalities  2.  History of alcohol abuse. -He currently does not exhibit signs or symptoms of alcohol withdrawal, has as needed IV Ativan  3.  Hypertension. -Blood pressures are better controlled, will continue Norvasc 10 mg by mouth daily  Code Status: Full Code Family Communication:  Disposition Plan: Anticipate discharge in the next24 hours    HPI/Subjective: Patient currently denies CP, SOB, N/V  Objective: Filed Vitals:   03/31/15 1621  BP: 125/81  Pulse: 65  Temp: 98.1 F (36.7 C)  Resp: 18    Intake/Output Summary (Last 24 hours) at 03/31/15 1924 Last data filed at 03/31/15 1330  Gross per 24 hour  Intake    720 ml  Output      0 ml  Net    720 ml   Filed Weights   03/30/15 1917 03/30/15 2248  Weight: 65.772 kg (145 lb) 67.495 kg (148 lb 12.8 oz)    Exam:   General:  Patient appears chronically ill, older than stated age, awake and alert oriented  Cardiovascular: Regular rate and rhythm normal S1-S2  Respiratory: Normal respiratory effort  Abdomen: Soft nontender nondistended  Musculoskeletal: No edema  Data Reviewed: Basic Metabolic Panel:  Recent Labs Lab 03/30/15 1952 03/31/15 0548  NA 141 142  K 4.2 3.9  CL 105 106  CO2 31 30  GLUCOSE 82 90  BUN 15 16  CREATININE 0.92 1.03  CALCIUM 9.5 9.2   Liver Function Tests: No results for input(s): AST, ALT, ALKPHOS, BILITOT, PROT, ALBUMIN in the last 168 hours. No results for input(s):  LIPASE, AMYLASE in the last 168 hours. No results for input(s): AMMONIA in the last 168 hours. CBC:  Recent Labs Lab 03/30/15 1952 03/31/15 0548  WBC 6.8 6.8  NEUTROABS 3.2  --   HGB 13.9 13.1  HCT 41.4 39.7  MCV 94.5 95.2  PLT 219 219   Cardiac Enzymes:  Recent Labs Lab 03/31/15 0315 03/31/15 1131  TROPONINI <0.03 <0.03   BNP (last 3 results) No results for input(s): BNP in the last 8760 hours.  ProBNP (last 3 results) No results for input(s): PROBNP in the last 8760 hours.  CBG: No results for input(s): GLUCAP in the last 168 hours.  No results found for this or any previous visit (from the past 240 hour(s)).   Studies: Dg Chest 2 View  03/30/2015   CLINICAL DATA:  64 year old male with fever and cough  EXAM: CHEST  2 VIEW  COMPARISON:  Radiograph dated 04/09/2014  FINDINGS: Two views of the chest demonstrate emphysematous changes of the lungs. No focal consolidation, pleural effusion, or pneumothorax. The cardiac silhouette is within normal limits. The osseous structures are grossly unremarkable.  IMPRESSION: No active cardiopulmonary disease.   Electronically Signed   By: Elgie Collard M.D.   On: 03/30/2015 19:58    Scheduled Meds: . enoxaparin (LOVENOX) injection  40 mg Subcutaneous QHS  . folic acid  1 mg Oral Daily  . LORazepam  0-4 mg Intravenous Q6H   Followed by  . [START ON 04/02/2015] LORazepam  0-4 mg Intravenous Q12H  . multivitamin with minerals  1 tablet Oral Daily  . nicotine  21 mg Transdermal Daily  . sodium chloride  3 mL Intravenous Q12H  . thiamine  100 mg Oral Daily   Or  . thiamine  100 mg Intravenous Daily  . traZODone  100 mg Oral QHS,MR X 1   Continuous Infusions:   Principal Problem:   Chest pain Active Problems:   Substance induced mood disorder   Alcohol dependence   Cocaine dependence   Hypertension   COPD (chronic obstructive pulmonary disease)   Suicidal ideation   Schizophrenia    Time spent: 15 min    Jeralyn Bennett  [facility] Pager [phone]. If 7PM-7AM, please contact night-coverage at [email or web address], password [facility] 03/31/2015, 7:24 PM  LOS: 1 day

## 2015-04-01 DIAGNOSIS — F1994 Other psychoactive substance use, unspecified with psychoactive substance-induced mood disorder: Secondary | ICD-10-CM | POA: Diagnosis not present

## 2015-04-01 DIAGNOSIS — I10 Essential (primary) hypertension: Secondary | ICD-10-CM | POA: Diagnosis not present

## 2015-04-01 DIAGNOSIS — R079 Chest pain, unspecified: Secondary | ICD-10-CM

## 2015-04-01 DIAGNOSIS — F1024 Alcohol dependence with alcohol-induced mood disorder: Secondary | ICD-10-CM | POA: Diagnosis not present

## 2015-04-01 DIAGNOSIS — F14222 Cocaine dependence with intoxication with perceptual disturbance: Secondary | ICD-10-CM | POA: Diagnosis not present

## 2015-04-01 DIAGNOSIS — F1421 Cocaine dependence, in remission: Secondary | ICD-10-CM

## 2015-04-01 MED ORDER — PANTOPRAZOLE SODIUM 40 MG PO TBEC: 40.0000 mg | DELAYED_RELEASE_TABLET | Freq: Every day | ORAL | Status: AC

## 2015-04-01 MED ORDER — ASPIRIN EC 81 MG PO TBEC: 81.0000 mg | DELAYED_RELEASE_TABLET | Freq: Every day | ORAL | Status: AC

## 2015-04-01 MED ORDER — ASPIRIN EC 81 MG PO TBEC: 81.0000 mg | DELAYED_RELEASE_TABLET | Freq: Every day | ORAL | Status: DC

## 2015-04-01 MED ORDER — AMLODIPINE BESYLATE 10 MG PO TABS: 10.0000 mg | ORAL_TABLET | Freq: Every day | ORAL | Status: DC

## 2015-04-01 MED ORDER — NICOTINE 21 MG/24HR TD PT24: 21.0000 mg | MEDICATED_PATCH | Freq: Every day | TRANSDERMAL | Status: AC

## 2015-04-01 MED ORDER — PANTOPRAZOLE SODIUM 40 MG PO TBEC: 40.0000 mg | DELAYED_RELEASE_TABLET | Freq: Every day | ORAL | Status: DC

## 2015-04-01 MED ORDER — AMLODIPINE BESYLATE 10 MG PO TABS: 10.0000 mg | ORAL_TABLET | Freq: Every day | ORAL | Status: AC

## 2015-04-01 MED ORDER — TRAZODONE HCL 100 MG PO TABS: 100.0000 mg | ORAL_TABLET | Freq: Every evening | ORAL | Status: AC | PRN

## 2015-04-01 NOTE — Clinical Social Work Psych Assess (Signed)
Clinical Social Work Librarian, academic  Clinical Social Worker:  [place], [place] Date/Time:  04/01/2015, 3:19 PM Referred By:  Physician Date Referred:  04/01/15 Reason for Referral:  Behavioral Health Issues   Presenting Symptoms/Problems  Presenting Symptoms/Problems(in person's/family's own words):  Psych consulted due to IVC from [facility] for AVH with SI.   Abuse/Neglect/Trauma History  Abuse/Neglect/Trauma History:  Denies History Abuse/Neglect/Trauma History Comments (indicate dates):  N/A   Psychiatric History  Psychiatric History:  Inpatient/Hospitalization, Outpatient Treatment, Residential Treatment Psychiatric Medication:  Abilify, Trazodone   Current Mental Health Hospitalizations/Previous Mental Health History:  Patient reports he was diagnosed with schizophrenia and depression at the [facility] about 2 months ago.   Current Provider:  Vickii Penna and Date:  [place], [place]  Current Medications:   Scheduled Meds: . amLODipine  10 mg Oral Daily  . enoxaparin (LOVENOX) injection  40 mg Subcutaneous QHS  . folic acid  1 mg Oral Daily  . LORazepam  0-4 mg Intravenous Q6H   Followed by  . [START ON 04/02/2015] LORazepam  0-4 mg Intravenous Q12H  . multivitamin with minerals  1 tablet Oral Daily  . nicotine  21 mg Transdermal Daily  . sodium chloride  3 mL Intravenous Q12H  . thiamine  100 mg Oral Daily   Or  . thiamine  100 mg Intravenous Daily  . traZODone  100 mg Oral QHS,MR X 1   Continuous Infusions:  PRN Meds:.sodium chloride, acetaminophen **OR** acetaminophen, alum & mag hydroxide-simeth, cloNIDine, HYDROmorphone (DILAUDID) injection, hydrOXYzine, LORazepam **OR** LORazepam, ondansetron **OR** ondansetron (ZOFRAN) IV, oxyCODONE, sodium chloride    Previous Inpatient Admission/Date/Reason:  Patient has been to [facility] in the past.   Emotional Health/Current Symptoms  Suicide/Self Harm: None Reported Suicide Attempt in Past  (date/description):  Patient reports SI when at [facility] but denies any SI or HI and contacts for safety.  Other Harmful Behavior (ex. homicidal ideation) (describe):  None reported   Psychotic/Dissociative Symptoms  Psychotic/Dissociative Symptoms: None Reported Other Psychotic/Dissociative Symptoms:  Patient has experienced AVH in the past but denies any psychotic symptoms currently.   Attention/Behavioral Symptoms  Attention/Behavioral Symptoms: Within Normal Limits Other Attention/Behavioral Symptoms:  Patient engaged during assessment.   Cognitive Impairment  Cognitive Impairment:  Within Normal Limits Other Cognitive Impairment:  Patient alert and oriented.   Mood and Adjustment  Mood and Adjustment:  Mood Congruent   Stress, Anxiety, Trauma, Any Recent Loss/Stressor  Stress, Anxiety, Trauma, Any Recent Loss/Stressor: None Reported Anxiety (frequency):  N/A  Phobia (specify):  N/A  Compulsive Behavior (specify):  N/A  Obsessive Behavior (specify):  N/A  Other Stress, Anxiety, Trauma, Any Recent Loss/Stressor:  N/A   Substance Abuse/Use  Substance Abuse/Use: Current Substance Use SBIRT Completed (please refer for detailed history): No Self-reported Substance Use (last use and frequency):  Patient reports he drinks alcohol and uses cocaine. Patient is interested in SA treatment at [facility].  Urinary Drug Screen Completed: No Alcohol Level:  N/A   Environment/Housing/Living Arrangement  Environmental/Housing/Living Arrangement: Homeless Who is in the Home:  [facility]  Emergency Contact:  None currently   Financial  Financial: Medicare   Patient's Strengths and Goals  Patient's Strengths and Goals (patient's own words):  Patient reports he has sought treatment in the past. Patient hopes to move to [facility] in the future.   Clinical Social Worker's Interpretive Summary  Clinical Social Workers Interpretive Summary:    CSW and psych MD rounded on  patient together. Patient from [place] with SI  and hallucinations. Patient's IVC expired and is no longer valid. Patient reports he is homeless and came to [facility] a few months ago. Patient is not married but has a son in [place] and another son in [place]. Patient reports he plans to move to [facility] in the future but does not have any definite plans.  Patient uses alcohol and cocaine. Patient receives a disability check but has been staying at the homeless shelter due to spending money on motel and substances. Patient agreeable to follow up at [facility] for [facility] medication management.  Patient interested in going to [facility] for treatment. Patient signed ROI form which was placed on the chart and CSW faxed clinicals to [facility]. CSW placed information for [facility] and [facility] on AVS and encouraged patient to follow up with [facility] for when bed is available. Patient reports he will get a friend to give him a ride to [facility] and will follow up with [facility] for SA counseling until he is able to get a treatment bed at [facility].   Disposition  Disposition: Outpatient Referral Made/Needed        Unk Lightning, LCSW [phone]

## 2015-04-01 NOTE — Discharge Summary (Addendum)
PATIENT DETAILS Name: Ryan Reid Age: 64 y.o. Sex: male Date of Birth: 1951-04-21 MRN: [id number]. Admitting Physician: Ron Parker, MD PCP:No primary care provider on file.  Admit Date: 03/30/2015 Discharge date: 04/01/2015  Recommendations for Outpatient Follow-up:  1. Consider referral to GI and Cards if pain recurrs.  PRIMARY DISCHARGE DIAGNOSIS:  Principal Problem:   Substance induced mood disorder Active Problems:   Alcohol dependence   Cocaine dependence   Chest pain   Hypertension   COPD (chronic obstructive pulmonary disease)   Suicidal ideation   Schizophrenia      PAST MEDICAL HISTORY: Past Medical History  Diagnosis Date  . Homeless   . Hypertension   . H/O suicide attempt     reports stabbed self  . H/O suicide attempt     reports jumped from tower  . COPD (chronic obstructive pulmonary disease)     DISCHARGE MEDICATIONS: Current Discharge Medication List    START taking these medications   Details  amLODipine (NORVASC) 10 MG tablet Take 1 tablet (10 mg total) by mouth daily. Qty: 30 tablet, Refills: 0    aspirin EC 81 MG tablet Take 1 tablet (81 mg total) by mouth daily. Qty: 30 tablet, Refills: 0    pantoprazole (PROTONIX) 40 MG tablet Take 1 tablet (40 mg total) by mouth daily. Switch for any other PPI at similar dose and frequency Qty: 30 tablet, Refills: 0      CONTINUE these medications which have CHANGED   Details  nicotine (NICODERM CQ - DOSED IN MG/24 HOURS) 21 mg/24hr patch Place 1 patch (21 mg total) onto the skin daily. For smoking cessation Qty: 28 patch, Refills: 0    traZODone (DESYREL) 100 MG tablet Take 1 tablet (100 mg total) by mouth at bedtime and may repeat dose one time if needed. For sleep Qty: 60 tablet, Refills: 0      CONTINUE these medications which have NOT CHANGED   Details  hydrOXYzine (ATARAX/VISTARIL) 25 MG tablet Take 1 tablet (25 mg total) by mouth every 6 (six) hours as needed for  anxiety. Qty: 45 tablet, Refills: 0        ALLERGIES:   Allergies  Allergen Reactions  . Lactose Intolerance (Gi) Nausea And Vomiting    BRIEF HPI:  See H&P, Labs, Consult and Test reports for all details in brief, patient was admitted for evaluation of chest/Epigastric and Rt flank pain  CONSULTATIONS:   None  PERTINENT RADIOLOGIC STUDIES: Dg Chest 2 View  03/30/2015   CLINICAL DATA:  64 year old male with fever and cough  EXAM: CHEST  2 VIEW  COMPARISON:  Radiograph dated 04/09/2014  FINDINGS: Two views of the chest demonstrate emphysematous changes of the lungs. No focal consolidation, pleural effusion, or pneumothorax. The cardiac silhouette is within normal limits. The osseous structures are grossly unremarkable.  IMPRESSION: No active cardiopulmonary disease.   Electronically Signed   By: Elgie Collard M.D.   On: 03/30/2015 19:58     PERTINENT LAB RESULTS: CBC:  Recent Labs  03/30/15 1952 03/31/15 0548  WBC 6.8 6.8  HGB 13.9 13.1  HCT 41.4 39.7  PLT 219 219   CMET CMP     Component Value Date/Time   NA 142 03/31/2015 0548   K 3.9 03/31/2015 0548   CL 106 03/31/2015 0548   CO2 30 03/31/2015 0548   GLUCOSE 90 03/31/2015 0548   BUN 16 03/31/2015 0548   CREATININE 1.03 03/31/2015 0548   CALCIUM 9.2 03/31/2015 0548  PROT 7.6 03/13/2015 2330   ALBUMIN 3.9 03/13/2015 2330   AST 24 03/13/2015 2330   ALT 27 03/13/2015 2330   ALKPHOS 56 03/13/2015 2330   BILITOT 0.4 03/13/2015 2330   GFRNONAA >60 03/31/2015 0548   GFRAA >60 03/31/2015 0548    GFR Estimated Creatinine Clearance: 71 mL/min (by C-G formula based on Cr of 1.03). No results for input(s): LIPASE, AMYLASE in the last 72 hours.  Recent Labs  03/31/15 0315 03/31/15 1131  TROPONINI <0.03 <0.03   Invalid input(s): POCBNP No results for input(s): DDIMER in the last 72 hours. No results for input(s): HGBA1C in the last 72 hours. No results for input(s): CHOL, HDL, LDLCALC, TRIG, CHOLHDL,  LDLDIRECT in the last 72 hours. No results for input(s): TSH, T4TOTAL, T3FREE, THYROIDAB in the last 72 hours.  Invalid input(s): FREET3 No results for input(s): VITAMINB12, FOLATE, FERRITIN, TIBC, IRON, RETICCTPCT in the last 72 hours. Coags: No results for input(s): INR in the last 72 hours.  Invalid input(s): PT Microbiology: No results found for this or any previous visit (from the past 240 hour(s)).   BRIEF HOSPITAL COURSE:   Principal Problem: Chest pain:more of right upper flank pain. Some question of chest pain-EKG/Trop negative. Echo neg for wall motion abnormalities. Pain has completely resolved. No vomiting or fever. Abd is benign on exam-very soft-with no RUQ tenderness of exam. No CVA tenderness. No Dysuria/frequency of urination. Since pain has resolved, eating well-stable for discharge today.Start PPI on discharge-as this could have been Etoh related gastritis. If pain recurs suggest outpatient GI eval.  Active Problems: Substance induced mood disorder:on admission had auditory hallucinations-none now-transfer back to [facility] for continued care  Addendum: Seen by psychiatry on 9/15-spoke with Dr. Richardo Priest need to transfer to inpatient psychiatry at [facility] to discharge home.  Alcohol dependence/ Cocaine dependence:counseled-psych social worker to set up outpatient services.  Hypertension:continue Amlodipine  TODAY-DAY OF DISCHARGE:  Subjective:   Ryan Reid today has no headache,no chest abdominal pain,no new weakness tingling or numbness, feels much better wants to go home today.   Objective:   Blood pressure 118/55, pulse 66, temperature 97.3 F (36.3 C), temperature source Oral, resp. rate 18, height 5\' 10"  (1.778 m), weight 67.495 kg (148 lb 12.8 oz), SpO2 100 %.  Intake/Output Summary (Last 24 hours) at 04/01/15 1258 Last data filed at 04/01/15 0819  Gross per 24 hour  Intake    720 ml  Output      0 ml  Net    720 ml   Filed Weights    03/30/15 1917 03/30/15 2248  Weight: 65.772 kg (145 lb) 67.495 kg (148 lb 12.8 oz)    Exam Awake Alert, Oriented *3, No new F.N deficits, Normal affect [place].AT,PERRAL Supple Neck,No JVD, No cervical lymphadenopathy appriciated.  Symmetrical Chest wall movement, Good air movement bilaterally, CTAB RRR,No Gallops,Rubs or new Murmurs, No Parasternal Heave +ve B.Sounds, Abd Soft, Non tender, No organomegaly appriciated, No rebound -guarding or rigidity. No Cyanosis, Clubbing or edema, No new Rash or bruise  DISCHARGE CONDITION: Stable  DISPOSITION: [facility]  DISCHARGE INSTRUCTIONS:    Activity:  As tolerated with Full fall precautions use walker/cane & assistance as needed  Get Medicines reviewed and adjusted: Please take all your medications with you for your next visit with your Primary MD  Please request your Primary MD to go over all hospital tests and procedure/radiological results at the follow up, please ask your Primary MD to get all Hospital records sent to his/her office.  If you experience worsening of your admission symptoms, develop shortness of breath, life threatening emergency, suicidal or homicidal thoughts you must seek medical attention immediately by calling 911 or calling your MD immediately  if symptoms less severe.  You must read complete instructions/literature along with all the possible adverse reactions/side effects for all the Medicines you take and that have been prescribed to you. Take any new Medicines after you have completely understood and accpet all the possible adverse reactions/side effects.   Do not drive when taking Pain medications.   Do not take more than prescribed Pain, Sleep and Anxiety Medications  Special Instructions: If you have smoked or chewed Tobacco  in the last 2 yrs please stop smoking, stop any regular Alcohol  and or any Recreational drug use.  Wear Seat belts while driving.  Please note  You were cared for by a hospitalist  during your hospital stay. Once you are discharged, your primary care physician will handle any further medical issues. Please note that NO REFILLS for any discharge medications will be authorized once you are discharged, as it is imperative that you return to your primary care physician (or establish a relationship with a primary care physician if you do not have one) for your aftercare needs so that they can reassess your need for medications and monitor your lab values.   Diet recommendation: Heart Healthy diet  Discharge Instructions    Diet - low sodium heart healthy    Complete by:  As directed      Increase activity slowly    Complete by:  As directed           Follow-up Information    Follow up with PCP. Schedule an appointment as soon as possible for a visit in 1 week.      Call [facility].   Why:  To schedule appointment for screening for substance abuse treatment   Contact information:   Address: [address] [address], [place] [address] [phone]      Call [facility].   Specialty:  Professional Counselor   Why:  To schedule appointment for substance abuse counseling until you can get a bed at [facility] information:   [address] [place] [address] [phone]       Follow up with [facility]: Lambert Rhonda W.   Specialty:  Behavioral Health   Why:  Walk-in clinic open Monday-Friday 8:30am-12pm for mental health treatment   Contact information:   [address] [place] [address] [phone]       Total Time spent on discharge equals 25  minutes.  SignedJeoffrey Massed 04/01/2015 12:58 PM

## 2015-04-01 NOTE — Consult Note (Signed)
[facility] Consult   Reason for Consult:  Substance induced mood disorder Referring Physician:  Dr. Sloan Leiter Patient Identification: Ryan Reid MRN:  [id number] Principal Diagnosis: Substance induced mood disorder Diagnosis:   Patient Active Problem List   Diagnosis Date Noted  . Suicidal ideation [R45.851] 03/31/2015  . Schizophrenia [F20.9] 03/31/2015  . Chest pain [R07.9] 03/30/2015  . Hypertension [I10] 03/30/2015  . COPD (chronic obstructive pulmonary disease) [J44.9] 03/30/2015  . Substance induced mood disorder [F19.94] 03/02/2015  . Alcohol dependence [F10.20] 03/02/2015  . Cocaine dependence [F14.20] 03/02/2015    Total Time spent with patient: 45 minutes  Subjective:   Ryan Reid is a 64 y.o. male patient admitted with chest pain.  HPI:  Ryan Reid is a 64 y.o. male with a history of Polysubstance abuse including cocaine, alcoholism and schizophrenia who was sent from the [facility] to the ED due to complaints of chest pain. Patient has been medically cleared and his chest pain has been resolved and work up is negative. Patient has denied current symptoms of depression, anxiety, psychosis and suicide or homicide ideation, intention or plans. He denies nausea vomiting or diaphoresis.  Patient stated that he was in a substance rehab in [place] and later stayed with his son in [place], [place]. He came to [place], [place] about two months ago to see his sister and than become relapsed on drug of abuse which causes auditory hallucinations, depression and suicide and feels better when not intoxicated. Patient has one previous admission to [facility] about a month ago and than referred to [facility] rehab which patient did not go. He reportedly lives in [place] when he has funds and than go to [facility] if no funds. He is disabled veteran and has been treated in [place] for medical condition.   HPI Elements:   Location:  substance abuse, hallucinations and  depression. Quality:  fair to poor. Severity:  moderate. Timing:  relapse of alcohol and cocaine. Duration:  few months. Context:  psychosocial stresses and poor family support.  Past Medical History:  Past Medical History  Diagnosis Date  . Homeless   . Hypertension   . H/O suicide attempt     reports stabbed self  . H/O suicide attempt     reports jumped from tower  . COPD (chronic obstructive pulmonary disease)    History reviewed. No pertinent past surgical history. Family History: No family history on file. Social History:  History  Alcohol Use  . Yes    Comment: 1 beer a day     History  Drug Use  . Yes  . Special: Cocaine    Social History   Social History  . Marital Status: Single    Spouse Name: N/A  . Number of Children: N/A  . Years of Education: N/A   Social History Main Topics  . Smoking status: Current Every Day Smoker -- 2.00 packs/day  . Smokeless tobacco: None  . Alcohol Use: Yes     Comment: 1 beer a day  . Drug Use: Yes    Special: Cocaine  . Sexual Activity: Not Asked   Other Topics Concern  . None   Social History Narrative   Additional Social History: He has two sons, one lives in [place] and other in [place]. He has sister, aunt and uncle in [place] but does not want to have contact with them.        Allergies:   Allergies  Allergen Reactions  . Lactose Intolerance (Gi) Nausea And  Vomiting    Labs:  Results for orders placed or performed during the hospital encounter of 03/30/15 (from the past 48 hour(s))  CBC with Differential     Status: None   Collection Time: 03/30/15  7:52 PM  Result Value Ref Range   WBC 6.8 4.0 - 10.5 K/uL   RBC 4.38 4.22 - 5.81 MIL/uL   Hemoglobin 13.9 13.0 - 17.0 g/dL   HCT 41.4 39.0 - 52.0 %   MCV 94.5 78.0 - 100.0 fL   MCH 31.7 26.0 - 34.0 pg   MCHC 33.6 30.0 - 36.0 g/dL   RDW 12.9 11.5 - 15.5 %   Platelets 219 150 - 400 K/uL   Neutrophils Relative % 47 43 - 77 %   Neutro Abs 3.2 1.7 - 7.7 K/uL    Lymphocytes Relative 41 12 - 46 %   Lymphs Abs 2.8 0.7 - 4.0 K/uL   Monocytes Relative 8 3 - 12 %   Monocytes Absolute 0.5 0.1 - 1.0 K/uL   Eosinophils Relative 4 0 - 5 %   Eosinophils Absolute 0.3 0.0 - 0.7 K/uL   Basophils Relative 0 0 - 1 %   Basophils Absolute 0.0 0.0 - 0.1 K/uL  Basic metabolic panel     Status: None   Collection Time: 03/30/15  7:52 PM  Result Value Ref Range   Sodium 141 135 - 145 mmol/L   Potassium 4.2 3.5 - 5.1 mmol/L   Chloride 105 101 - 111 mmol/L   CO2 31 22 - 32 mmol/L   Glucose, Bld 82 65 - 99 mg/dL   BUN 15 6 - 20 mg/dL   Creatinine, Ser 0.92 0.61 - 1.24 mg/dL   Calcium 9.5 8.9 - 10.3 mg/dL   GFR calc non Af Amer >60 >60 mL/min   GFR calc Af Amer >60 >60 mL/min    Comment: (NOTE) The eGFR has been calculated using the CKD EPI equation. This calculation has not been validated in all clinical situations. eGFR's persistently <60 mL/min signify possible Chronic Kidney Disease.    Anion gap 5 5 - 15  I-Stat Troponin, ED (not at [facility])     Status: None   Collection Time: 03/30/15  7:57 PM  Result Value Ref Range   Troponin i, poc 0.01 0.00 - 0.08 ng/mL   Comment 3            Comment: Due to the release kinetics of cTnI, a negative result within the first hours of the onset of symptoms does not rule out myocardial infarction with certainty. If myocardial infarction is still suspected, repeat the test at appropriate intervals.   Troponin I     Status: None   Collection Time: 03/31/15  3:15 AM  Result Value Ref Range   Troponin I <0.03 <0.031 ng/mL    Comment:        NO INDICATION OF MYOCARDIAL INJURY.   Basic metabolic panel     Status: None   Collection Time: 03/31/15  5:48 AM  Result Value Ref Range   Sodium 142 135 - 145 mmol/L   Potassium 3.9 3.5 - 5.1 mmol/L   Chloride 106 101 - 111 mmol/L   CO2 30 22 - 32 mmol/L   Glucose, Bld 90 65 - 99 mg/dL   BUN 16 6 - 20 mg/dL   Creatinine, Ser 1.03 0.61 - 1.24 mg/dL   Calcium 9.2 8.9 - 10.3  mg/dL   GFR calc non Af Amer >60 >60 mL/min  GFR calc Af Amer >60 >60 mL/min    Comment: (NOTE) The eGFR has been calculated using the CKD EPI equation. This calculation has not been validated in all clinical situations. eGFR's persistently <60 mL/min signify possible Chronic Kidney Disease.    Anion gap 6 5 - 15  CBC     Status: Abnormal   Collection Time: 03/31/15  5:48 AM  Result Value Ref Range   WBC 6.8 4.0 - 10.5 K/uL   RBC 4.17 (L) 4.22 - 5.81 MIL/uL   Hemoglobin 13.1 13.0 - 17.0 g/dL   HCT 39.7 39.0 - 52.0 %   MCV 95.2 78.0 - 100.0 fL   MCH 31.4 26.0 - 34.0 pg   MCHC 33.0 30.0 - 36.0 g/dL   RDW 13.1 11.5 - 15.5 %   Platelets 219 150 - 400 K/uL  Troponin I     Status: None   Collection Time: 03/31/15 11:31 AM  Result Value Ref Range   Troponin I <0.03 <0.031 ng/mL    Comment:        NO INDICATION OF MYOCARDIAL INJURY.     Vitals: Blood pressure 120/81, pulse 84, temperature 97.3 F (36.3 C), temperature source Oral, resp. rate 18, height 5' 10"  (1.778 m), weight 67.495 kg (148 lb 12.8 oz), SpO2 100 %.  Risk to Self: Is patient at risk for suicide?: Yes Risk to Others:   Prior Inpatient Therapy:   Prior Outpatient Therapy:    Current Facility-Administered Medications  Medication Dose Route Frequency Provider Last Rate Last Dose  . 0.9 %  sodium chloride infusion  250 mL Intravenous PRN Theressa Millard, MD      . acetaminophen (TYLENOL) tablet 650 mg  650 mg Oral Q6H PRN Theressa Millard, MD       Or  . acetaminophen (TYLENOL) suppository 650 mg  650 mg Rectal Q6H PRN Theressa Millard, MD      . alum & mag hydroxide-simeth (MAALOX/MYLANTA) 200-200-20 MG/5ML suspension 30 mL  30 mL Oral Q6H PRN Theressa Millard, MD      . amLODipine (NORVASC) tablet 10 mg  10 mg Oral Daily Kelvin Cellar, MD   10 mg at 04/01/15 0926  . cloNIDine (CATAPRES) tablet 0.1 mg  0.1 mg Oral Q6H PRN Theressa Millard, MD      . enoxaparin (LOVENOX) injection 40 mg  40 mg  Subcutaneous QHS Theressa Millard, MD   40 mg at 03/31/15 0018  . folic acid (FOLVITE) tablet 1 mg  1 mg Oral Daily Theressa Millard, MD   1 mg at 04/01/15 0926  . HYDROmorphone (DILAUDID) injection 0.5-1 mg  0.5-1 mg Intravenous Q3H PRN Theressa Millard, MD      . hydrOXYzine (ATARAX/VISTARIL) tablet 25 mg  25 mg Oral Q6H PRN Theressa Millard, MD      . LORazepam (ATIVAN) injection 0-4 mg  0-4 mg Intravenous Q6H Theressa Millard, MD   0 mg at 03/31/15 1106   Followed by  . [START ON 04/02/2015] LORazepam (ATIVAN) injection 0-4 mg  0-4 mg Intravenous Q12H Harvette C Jenkins, MD      . LORazepam (ATIVAN) tablet 1 mg  1 mg Oral Q6H PRN Theressa Millard, MD       Or  . LORazepam (ATIVAN) injection 1 mg  1 mg Intravenous Q6H PRN Theressa Millard, MD      . multivitamin with minerals tablet 1 tablet  1 tablet Oral Daily Harvette C Jenkins,  MD   1 tablet at 04/01/15 0926  . nicotine (NICODERM CQ - dosed in mg/24 hours) patch 21 mg  21 mg Transdermal Daily Theressa Millard, MD   21 mg at 04/01/15 0930  . ondansetron (ZOFRAN) tablet 4 mg  4 mg Oral Q6H PRN Theressa Millard, MD       Or  . ondansetron (ZOFRAN) injection 4 mg  4 mg Intravenous Q6H PRN Theressa Millard, MD      . oxyCODONE (Oxy IR/ROXICODONE) immediate release tablet 5 mg  5 mg Oral Q4H PRN Theressa Millard, MD      . sodium chloride 0.9 % injection 3 mL  3 mL Intravenous Q12H Theressa Millard, MD   3 mL at 04/01/15 0926  . sodium chloride 0.9 % injection 3 mL  3 mL Intravenous PRN Theressa Millard, MD      . thiamine (VITAMIN B-1) tablet 100 mg  100 mg Oral Daily Theressa Millard, MD   100 mg at 04/01/15 9211   Or  . thiamine (B-1) injection 100 mg  100 mg Intravenous Daily Theressa Millard, MD      . traZODone (DESYREL) tablet 100 mg  100 mg Oral QHS,MR X 1 Theressa Millard, MD   100 mg at 03/31/15 2250    Musculoskeletal: Strength & Muscle Tone: within normal limits Gait & Station: normal Patient  leans: N/A  Psychiatric Specialty Exam: Physical Exam as per history and physical  ROS  No Fever-chills, No Headache, No changes with Vision or hearing, reports vertigo No problems swallowing food or Liquids, No Chest pain, Cough or Shortness of Breath, No Abdominal pain, No Nausea or Vommitting, Bowel movements are regular, No Blood in stool or Urine, No dysuria, No new skin rashes or bruises, No new joints pains-aches,  No new weakness, tingling, numbness in any extremity, No recent weight gain or loss, No polyuria, polydypsia or polyphagia,   A full 10 point Review of Systems was done, except as stated above, all other Review of Systems were negative.  Blood pressure 120/81, pulse 84, temperature 97.3 F (36.3 C), temperature source Oral, resp. rate 18, height 5' 10"  (1.778 m), weight 67.495 kg (148 lb 12.8 oz), SpO2 100 %.Body mass index is 21.35 kg/(m^2).  General Appearance: Disheveled  Eye Contact::  Good  Speech:  Clear and Coherent and Slow  Volume:  Decreased  Mood:  Depressed  Affect:  Appropriate and Congruent  Thought Process:  Coherent and Goal Directed  Orientation:  Full (Time, Place, and Person)  Thought Content:  WDL  Suicidal Thoughts:  No  Homicidal Thoughts:  No  Memory:  Immediate;   Fair Recent;   Fair  Judgement:  Intact  Insight:  Fair  Psychomotor Activity:  Decreased  Concentration:  Fair  Recall:  Good  Fund of Knowledge:Good  Language: Good  Akathisia:  Negative  Handed:  Right  AIMS (if indicated):     Assets:  Communication Skills Desire for Improvement Leisure Time Resilience Social Support  ADL's:  Intact  Cognition: WNL  Sleep:      Medical Decision Making: Review of Psycho-Social Stressors (1), Review or order clinical lab tests (1), Established Problem, Worsening (2), Review of Last Therapy Session (1), Review or order medicine tests (1), Review of Medication Regimen & Side Effects (2) and Review of New Medication or Change in  Dosage (2)  Treatment Plan Summary: Patient IVC is not valid and does not need to be  placed on IVC at this time as he is psychiatrically stable and clear for discharge to out patient care and subnstace abuse rehab treatment Recommend no changes in his psych medication and continue Trazodone for depression and insomnai No evidence of imminent risk to self or others at present.   Patient does not meet criteria for psychiatric inpatient admission. Supportive therapy provided about ongoing stressors.  Appreciate psychiatric consultation and will sign off at this time Please contact [phone] or [phone] if needs further assistance  Disposition: Refer to [facility] for out patient psych medication management and also provided referral to [facility] recovery for substance abuse rehab.   Osman Calzadilla,JANARDHAHA R. 04/01/2015 10:46 AM

## 2015-04-01 NOTE — Progress Notes (Signed)
D/C instructions reviewed w/ pt. Pt verbalizes understanding, all questions answered. Pt amb off unit in escorted by NT to bus stop. Pt in possession of d/c instructions and all personal belongings. Pt aware of need to p/u scripts after d/c.

## 2015-04-10 ENCOUNTER — Encounter (HOSPITAL_COMMUNITY): Payer: Self-pay | Admitting: Emergency Medicine

## 2015-04-10 ENCOUNTER — Emergency Department (HOSPITAL_COMMUNITY)
Admission: EM | Admit: 2015-04-10 | Discharge: 2015-04-11 | Disposition: A | Discharge status: Home / Self Care | Payer: Medicare Other | Attending: Emergency Medicine | Admitting: Emergency Medicine

## 2015-04-10 ENCOUNTER — Emergency Department (HOSPITAL_COMMUNITY): Payer: Medicare Other

## 2015-04-10 DIAGNOSIS — R079 Chest pain, unspecified: Secondary | ICD-10-CM | POA: Diagnosis not present

## 2015-04-10 DIAGNOSIS — I10 Essential (primary) hypertension: Secondary | ICD-10-CM | POA: Diagnosis not present

## 2015-04-10 DIAGNOSIS — M545 Low back pain: Secondary | ICD-10-CM | POA: Diagnosis not present

## 2015-04-10 DIAGNOSIS — Z720 Tobacco use: Secondary | ICD-10-CM | POA: Insufficient documentation

## 2015-04-10 DIAGNOSIS — F1420 Cocaine dependence, uncomplicated: Secondary | ICD-10-CM | POA: Diagnosis present

## 2015-04-10 DIAGNOSIS — Z590 Homelessness: Secondary | ICD-10-CM | POA: Insufficient documentation

## 2015-04-10 DIAGNOSIS — F1994 Other psychoactive substance use, unspecified with psychoactive substance-induced mood disorder: Secondary | ICD-10-CM

## 2015-04-10 DIAGNOSIS — F1914 Other psychoactive substance abuse with psychoactive substance-induced mood disorder: Secondary | ICD-10-CM | POA: Insufficient documentation

## 2015-04-10 DIAGNOSIS — R52 Pain, unspecified: Secondary | ICD-10-CM | POA: Diagnosis present

## 2015-04-10 DIAGNOSIS — R4781 Slurred speech: Secondary | ICD-10-CM | POA: Diagnosis not present

## 2015-04-10 DIAGNOSIS — J441 Chronic obstructive pulmonary disease with (acute) exacerbation: Secondary | ICD-10-CM | POA: Insufficient documentation

## 2015-04-10 DIAGNOSIS — F14220 Cocaine dependence with intoxication, uncomplicated: Secondary | ICD-10-CM | POA: Diagnosis not present

## 2015-04-10 LAB — URINALYSIS, ROUTINE W REFLEX MICROSCOPIC
Bilirubin Urine: NEGATIVE
Glucose, UA: NEGATIVE mg/dL
Hgb urine dipstick: NEGATIVE
KETONES UR: 15 mg/dL — AB
LEUKOCYTES UA: NEGATIVE
NITRITE: NEGATIVE
PROTEIN: NEGATIVE mg/dL
Specific Gravity, Urine: 1.021 (ref 1.005–1.030)
UROBILINOGEN UA: 1 mg/dL (ref 0.0–1.0)
pH: 6 (ref 5.0–8.0)

## 2015-04-10 LAB — CBC WITH DIFFERENTIAL/PLATELET
BASOS PCT: 0 %
Basophils Absolute: 0 10*3/uL (ref 0.0–0.1)
EOS ABS: 0.2 10*3/uL (ref 0.0–0.7)
EOS PCT: 2 %
HCT: 42.1 % (ref 39.0–52.0)
Hemoglobin: 14.3 g/dL (ref 13.0–17.0)
Lymphocytes Relative: 14 %
Lymphs Abs: 1.5 10*3/uL (ref 0.7–4.0)
MCH: 32.1 pg (ref 26.0–34.0)
MCHC: 34 g/dL (ref 30.0–36.0)
MCV: 94.6 fL (ref 78.0–100.0)
MONO ABS: 0.7 10*3/uL (ref 0.1–1.0)
MONOS PCT: 6 %
Neutro Abs: 8.6 10*3/uL — ABNORMAL HIGH (ref 1.7–7.7)
Neutrophils Relative %: 78 %
PLATELETS: 245 10*3/uL (ref 150–400)
RBC: 4.45 MIL/uL (ref 4.22–5.81)
RDW: 13.5 % (ref 11.5–15.5)
WBC: 11 10*3/uL — ABNORMAL HIGH (ref 4.0–10.5)

## 2015-04-10 LAB — COMPREHENSIVE METABOLIC PANEL
ALBUMIN: 4.2 g/dL (ref 3.5–5.0)
ALT: 20 U/L (ref 17–63)
ANION GAP: 7 (ref 5–15)
AST: 27 U/L (ref 15–41)
Alkaline Phosphatase: 61 U/L (ref 38–126)
BILIRUBIN TOTAL: 0.5 mg/dL (ref 0.3–1.2)
BUN: 10 mg/dL (ref 6–20)
CO2: 27 mmol/L (ref 22–32)
Calcium: 9.5 mg/dL (ref 8.9–10.3)
Chloride: 107 mmol/L (ref 101–111)
Creatinine, Ser: 0.96 mg/dL (ref 0.61–1.24)
GFR calc Af Amer: >60 mL/min (ref >60)
GFR calc non Af Amer: >60 mL/min (ref >60)
GLUCOSE: 94 mg/dL (ref 65–99)
POTASSIUM: 4.1 mmol/L (ref 3.5–5.1)
SODIUM: 141 mmol/L (ref 135–145)
TOTAL PROTEIN: 7.7 g/dL (ref 6.5–8.1)

## 2015-04-10 LAB — RAPID URINE DRUG SCREEN, HOSP PERFORMED
Amphetamines: NOT DETECTED
BARBITURATES: NOT DETECTED
Benzodiazepines: NOT DETECTED
Cocaine: POSITIVE — AB
Opiates: NOT DETECTED
Tetrahydrocannabinol: NOT DETECTED

## 2015-04-10 LAB — ETHANOL: Alcohol, Ethyl (B): <5 mg/dL (ref <5)

## 2015-04-10 LAB — SALICYLATE LEVEL: Salicylate Lvl: <4 mg/dL (ref 2.8–30.0)

## 2015-04-10 LAB — TROPONIN I

## 2015-04-10 LAB — ACETAMINOPHEN LEVEL

## 2015-04-10 MED ORDER — IBUPROFEN 200 MG PO TABS: 600.0000 mg | ORAL_TABLET | Freq: Three times a day (TID) | ORAL | Status: DC | PRN

## 2015-04-10 MED ORDER — ALUM & MAG HYDROXIDE-SIMETH 200-200-20 MG/5ML PO SUSP: 30.0000 mL | ORAL | Status: DC | PRN

## 2015-04-10 MED ORDER — ONDANSETRON HCL 4 MG PO TABS: 4.0000 mg | ORAL_TABLET | Freq: Three times a day (TID) | ORAL | Status: DC | PRN

## 2015-04-10 MED ORDER — LORAZEPAM 1 MG PO TABS: 1.0000 mg | ORAL_TABLET | Freq: Three times a day (TID) | ORAL | Status: DC | PRN

## 2015-04-10 MED ORDER — ACETAMINOPHEN 325 MG PO TABS: 650.0000 mg | ORAL_TABLET | ORAL | Status: DC | PRN

## 2015-04-10 NOTE — ED Notes (Signed)
Patient still sleeping soundly.  Patient easily responsive to voice.

## 2015-04-10 NOTE — BH Assessment (Signed)
Assessment Note  Ryan Reid is an 64 y.o. male. Patient was brought into the ED by EMS from [facility] resource [facility]) for medical complaints and suicidal ideations.  Patient has denied any suicidal ideation to medical staff and this Clinical research associate.  Patient is drowsy and a poor historian at this time.  Patient was unable to answer question appropriately.  Patient reports homicidal ideations but refuses to provide details.  Patient reports last having an alcoholic drink was this morning about a half of beer.   This Clinical research associate consulted with Catha Nottingham, NP it is recommended to observe overnight and evaluate in the AM.   Axis I: Alcohol Abuse Axis II: Deferred Axis III:  Past Medical History  Diagnosis Date  . Homeless   . Hypertension   . H/O suicide attempt     reports stabbed self  . H/O suicide attempt     reports jumped from tower  . COPD (chronic obstructive pulmonary disease)    Axis IV: housing problems, other psychosocial or environmental problems, problems related to social environment, problems with access to health care services and problems with primary support group Axis V: 51-60 moderate symptoms  Past Medical History:  Past Medical History  Diagnosis Date  . Homeless   . Hypertension   . H/O suicide attempt     reports stabbed self  . H/O suicide attempt     reports jumped from tower  . COPD (chronic obstructive pulmonary disease)     History reviewed. No pertinent past surgical history.  Family History: No family history on file.  Social History:  reports that he has been smoking.  He does not have any smokeless tobacco history on file. He reports that he drinks alcohol. He reports that he uses illicit drugs (Cocaine).  Additional Social History:  Alcohol / Drug Use Pain Medications: See MARs Prescriptions: See MARs Over the Counter: See MARs History of alcohol / drug use?: Yes Longest period of sobriety (when/how long): unable to assess Negative Consequences of  Use: Financial, Personal relationships, Work / Programmer, multimedia Withdrawal Symptoms:  (unable to assess) Substance #1 Name of Substance 1: Alcohol 1 - Age of First Use: unable to assess 1 - Amount (size/oz): unable to assess 1 - Frequency: unable to assess 1 - Duration: unable to assess 1 - Last Use / Amount: 9/24 a half of can of beer  CIWA: CIWA-Ar BP: 124/67 mmHg Pulse Rate: 85 Nausea and Vomiting:  (unable to assess) Tactile Disturbances:  (unable to assess) Tremor:  (unable to assess) Auditory Disturbances:  (unable to assess) Paroxysmal Sweats:  (unable to assess) Anxiety:  (unable to assess) Agitation:  (unable to assess) Orientation and Clouding of Sensorium:  (unable to assess) COWS:    Allergies:  Allergies  Allergen Reactions  . Lactose Intolerance (Gi) Nausea And Vomiting    Home Medications:  (Not in a hospital admission)  OB/GYN Status:  No LMP for male patient.  General Assessment Data Location of Assessment: [facility] ED TTS Assessment: In system Is this a Tele or Face-to-Face Assessment?: Face-to-Face Is this an Initial Assessment or a Re-assessment for this encounter?: Initial Assessment Marital status: Divorced [place] name: na Is patient pregnant?: No Pregnancy Status: No Living Arrangements: Other (Comment) (hom[place], [place]) Can pt return to current living arrangement?: Yes Admission Status: Voluntary Is patient capable of signing voluntary admission?: Yes Referral Source: Self/Family/Friend Insurance type: medicare  Medical Screening Exam [facility] Walk-in ONLY) Medical Exam completed: Yes  Crisis Care Plan Living Arrangements: Other (Comment) (  homeless, living in woods) Name of Psychiatrist: none Name of Therapist: none  Education Status Is patient currently in school?: No Current Grade: na Highest grade of school patient has completed: 12th  Name of school: NA Contact person: NA  Risk to self with the past 6 months Suicidal Ideation:  No Has patient been a risk to self within the past 6 months prior to admission? : Yes Suicidal Intent: No Has patient had any suicidal intent within the past 6 months prior to admission? : No Is patient at risk for suicide?: No Suicidal Plan?: No Has patient had any suicidal plan within the past 6 months prior to admission? : Yes Specify Current Suicidal Plan: na Access to Means: No Specify Access to Suicidal Means: na What has been your use of drugs/alcohol within the last 12 months?: alcohol Previous Attempts/Gestures: Yes How many times?: 2 Other Self Harm Risks: no Triggers for Past Attempts: Unpredictable Intentional Self Injurious Behavior: None (unable to assess) Family Suicide History: Unable to assess Recent stressful life event(s):  (unable to assess) Persecutory voices/beliefs?:  (unable to assess) Depression:  (unable to assess) Depression Symptoms:  (unable to assess) Substance abuse history and/or treatment for substance abuse?: Yes  Risk to Others within the past 6 months Homicidal Ideation: Yes-Currently Present Does patient have any lifetime risk of violence toward others beyond the six months prior to admission? :  (unable to assess) Thoughts of Harm to Others: Yes-Currently Present Comment - Thoughts of Harm to Others: refused to answer "Its a secret" Current Homicidal Intent:  (unable to assess) Current Homicidal Plan:  (unable to assess) Describe Current Homicidal Plan: unable to assess Access to Homicidal Means:  (unable to assess) Identified Victim: refused to answer History of harm to others?:  (unable to assess) Assessment of Violence: None Noted Violent Behavior Description: unable to assess Does patient have access to weapons?:  (unable to assess) Criminal Charges Pending?:  (unable to assess) Does patient have a court date:  (unable to assess) Is patient on probation?:  (unable to assess)  Psychosis Hallucinations:  (unable to assess) Delusions:   (unable to assess)  Mental Status Report Appearance/Hygiene: In hospital gown Eye Contact: Poor Speech: Slurred (mumbling) Level of Consciousness: Drowsy Mood:  (unable to assess) Affect: Unable to Assess Anxiety Level:  (unable to assess) Thought Processes: Unable to Assess Judgement: Unable to Assess Obsessive Compulsive Thoughts/Behaviors: Unable to Assess  Cognitive Functioning Concentration: Unable to Assess Memory: Unable to Assess IQ:  (unable to assess) Insight: Unable to Assess Impulse Control: Unable to Assess Appetite:  (unable to assess) Weight Loss:  (unable to assess) Weight Gain:  (unable to assess) Sleep: Unable to Assess Total Hours of Sleep:  (unable to assess) Vegetative Symptoms: Unable to Assess  ADLScreening [facility] Assessment Services) Patient's cognitive ability adequate to safely complete daily activities?:  (unable to assess) Patient able to express need for assistance with ADLs?:  (unable to assess) Independently performs ADLs?:  (unable to assess)  Prior Inpatient Therapy Prior Inpatient Therapy: Yes Prior Therapy Dates: 08/16//16-08/23/16, other admits Prior Therapy Facilty/Provider(s): [facility], [facility] Reason for Treatment: Depression  Prior Outpatient Therapy Prior Outpatient Therapy: Yes Prior Therapy Dates: up until last year 2015 Prior Therapy Facilty/Provider(s):  (unable to assess) Reason for Treatment: depression, SI Does patient have an ACCT team?:  (unable to assess) Does patient have Intensive In-House Services?  : No (unable to assess) Does patient have [facility] services? : No Does patient have P4CC services?: No  ADL  Screening (condition at time of admission) Patient's cognitive ability adequate to safely complete daily activities?:  (unable to assess) Patient able to express need for assistance with ADLs?:  (unable to assess) Independently performs ADLs?:  (unable to assess)       Abuse/Neglect Assessment (Assessment  to be complete while patient is alone) Physical Abuse:  (unable to assess) Verbal Abuse:  (unable to assess) Sexual Abuse:  (unable to assess) Exploitation of patient/patient's resources:  (unable to assess) Self-Neglect:  (unable to assess) Possible abuse reported to::  (unable to assess) Values / Beliefs Cultural Requests During Hospitalization:  (unable to assess) Spiritual Requests During Hospitalization:  (unable to assess) Consults Spiritual Care Consult Needed:  (unable to assess) Social Work Consult Needed:  (unable to assess) Merchant navy officer (For Healthcare) Does patient have an advance directive?: No Would patient like information on creating an advanced directive?: No - patient declined information    Additional Information 1:1 In Past 12 Months?: No CIRT Risk: No Elopement Risk: No     Disposition:  Disposition Initial Assessment Completed for this Encounter: Yes Disposition of Patient: Other dispositions (pending) Type of inpatient treatment program:  (pending)  On Site Evaluation by:   Reviewed with Physician:    Maryelizabeth Rowan A 04/10/2015 1:45 PM

## 2015-04-10 NOTE — ED Notes (Signed)
Patient transported to CT 

## 2015-04-10 NOTE — ED Notes (Addendum)
Patient presents to the ED from [facility] (he is homeless and sleeps in the woods) with complaints of dizziness since earlier this morning. Patient states he was standing outside [facility] [address] rolling a cigarette when he became dizzy.  Patient is mumbling as he speaks and is difficulty to understand.  Patient intermittently complains of chest pain and ankle pain with palpation. Patient states his last use of ETOH and his last meal were last night.  Patient denies use of other substances.  Patient denies history of DT or seizures when he stops drinking.  Head normocephalic.  On exam, patients lung sounds are diminished, mild end expiratory wheezing lower lobes on auscultation.  Heart sounds distant, S1/S2.  +2 radial and pedal pulses.  No pre-tibial and pedal edema.  Patients abdomen is soft and non-tender to palpation.  Bowel sounds hypoactive.  CNIII intact 3mm, EOMI.  Arcus senilis noted bilaterally.  Skin warm and dry.

## 2015-04-10 NOTE — ED Notes (Signed)
Sandwich and soft drink given.  

## 2015-04-10 NOTE — ED Notes (Signed)
Pt. To [facility] from ED ambulatory without difficulty, to room 34 . Report from Britany RN. Pt. Is alert and oriented, warm and dry in no distress. Pt. Denies SI, HI, and AVH. Pt. Calm and cooperative. Pt. Made aware of security cameras and Q15 minute rounds. Pt. Encouraged to let Nursing staff know of any concerns or needs.  

## 2015-04-10 NOTE — ED Notes (Signed)
Per report received from Vernona Rieger RN  [facility] and [facility] and he became "ill"  Pt has been evaluated by TTS , unsure of placement,  Pt has been fed two sandwiches and orange juice.  Pt is alert and oriented in NAD

## 2015-04-10 NOTE — ED Notes (Signed)
Per EMS pt comes [employer] c/o abd pain.  Pt reported to fire, who was first on scene, that he did partake in ETOH last night.  Per EMS pt does smell of ETOH.  Pt was ambulatory with steady gait from ambulance to treatment room.

## 2015-04-10 NOTE — ED Notes (Signed)
Bed: ZO10 Expected date:  Expected time:  Means of arrival:  Comments: EMS-Abd Pain

## 2015-04-10 NOTE — ED Provider Notes (Signed)
CSN: [id number]     Arrival date & time 04/10/15  1051 History   First MD Initiated Contact with Patient 04/10/15 1124     Chief Complaint  Patient presents with  . generalized body pain    . Suicidal     HPI   Ryan Reid is a 64 y.o. male with a PMH of HTN, COPD, suicide attempt who presents to the ED from [facility] with reported dizziness. He states he was standing outside rolling a cigarette and became dizzy. He is a poor historian and mumbles when he speaks, which makes him difficult to understand. He reports he is homeless and sleeps in the woods. He complains of intermittent chest pain, and states it feels like something is "stuck" in his throat. He denies fever, chills, shortness of breath, abdominal pain, N/V/D/C, dysuria, urgency, frequency. He denies headache, lightheadedness, syncope, falls. He states last week he had thoughts of wanting to jump off of a tower. Currently he denies SI or HI. He states has auditory hallucinations. He denies drug use. He reports his last alcoholic drink was this morning, at which time he had a half can of beer.   Past Medical History  Diagnosis Date  . Homeless   . Hypertension   . H/O suicide attempt     reports stabbed self  . H/O suicide attempt     reports jumped from tower  . COPD (chronic obstructive pulmonary disease)    History reviewed. No pertinent past surgical history. No family history on file. Social History  Substance Use Topics  . Smoking status: Current Every Day Smoker -- 2.00 packs/day  . Smokeless tobacco: None  . Alcohol Use: Yes     Comment: 1 beer a day     Review of Systems  Constitutional: Negative for fever and chills.  Respiratory: Negative for shortness of breath.   Cardiovascular: Positive for chest pain.  Gastrointestinal: Negative for nausea, vomiting, abdominal pain, diarrhea and constipation.  Genitourinary: Negative for dysuria, urgency and frequency.  Musculoskeletal: Positive for back pain.   Reports left sided lumbar back pain. Denies bowel or bladder incontinence, saddle anesthesia, weakness.  Neurological: Negative for dizziness, syncope, weakness, light-headedness and headaches.  Psychiatric/Behavioral: Negative for confusion.  All other systems reviewed and are negative.     Allergies  Lactose intolerance (gi)  Home Medications   Prior to Admission medications   Medication Sig Start Date End Date Taking? Authorizing Provider  amLODipine (NORVASC) 10 MG tablet Take 1 tablet (10 mg total) by mouth daily. Patient not taking: Reported on 04/10/2015 04/01/15   Maretta Bees, MD  aspirin EC 81 MG tablet Take 1 tablet (81 mg total) by mouth daily. Patient not taking: Reported on 04/10/2015 04/01/15   Maretta Bees, MD  hydrOXYzine (ATARAX/VISTARIL) 25 MG tablet Take 1 tablet (25 mg total) by mouth every 6 (six) hours as needed for anxiety. Patient not taking: Reported on 04/10/2015 03/08/15   Sanjuana Kava, NP  nicotine (NICODERM CQ - DOSED IN MG/24 HOURS) 21 mg/24hr patch Place 1 patch (21 mg total) onto the skin daily. For smoking cessation Patient not taking: Reported on 04/10/2015 04/01/15   Maretta Bees, MD  pantoprazole (PROTONIX) 40 MG tablet Take 1 tablet (40 mg total) by mouth daily. Switch for any other PPI at similar dose and frequency Patient not taking: Reported on 04/10/2015 04/01/15   Maretta Bees, MD  traZODone (DESYREL) 100 MG tablet Take 1 tablet (100 mg total) by mouth  at bedtime and may repeat dose one time if needed. For sleep Patient not taking: Reported on 04/10/2015 04/01/15   Maretta Bees, MD    BP 119/64 mmHg  Pulse 90  Temp(Src) 98.7 F (37.1 C) (Oral)  Resp 14  SpO2 97% Physical Exam  Constitutional: He is oriented to person, place, and time. No distress.  Chronically ill-appearing male in no acute distress.  HENT:  Head: Normocephalic and atraumatic.  Right Ear: External ear normal.  Left Ear: External ear normal.  Nose: Nose  normal.  Mouth/Throat: Uvula is midline, oropharynx is clear and moist and mucous membranes are normal.  Eyes: Conjunctivae, EOM and lids are normal. Pupils are equal, round, and reactive to light. Right eye exhibits no discharge. Left eye exhibits no discharge. No scleral icterus.  Neck: Normal range of motion. Neck supple.  Cardiovascular: Normal rate, regular rhythm, normal heart sounds, intact distal pulses and normal pulses.   Pulmonary/Chest: Effort normal. No respiratory distress. He has wheezes. He has no rales. He exhibits no tenderness.  Mild end expiratory wheezes to lower lobes bilaterally.  Abdominal: Soft. Normal appearance and bowel sounds are normal. He exhibits no distension and no mass. There is no tenderness. There is no rigidity, no rebound and no guarding.  Musculoskeletal: Normal range of motion. He exhibits tenderness. He exhibits no edema.  Mild TTP of left lumbar paraspinal muscles. No midline tenderness, step-off, or deformity.  Neurological: He is oriented to person, place, and time. He has normal strength. No cranial nerve deficit or sensory deficit.  Patient appears drowsy. Speech slightly slurred, patient mumbles when he talks. No facial asymmetry. No focal neuro deficit.  Skin: Skin is warm, dry and intact. No rash noted. He is not diaphoretic. No erythema. No pallor.  Psychiatric: His behavior is normal. Judgment normal.  Patient reports plan to jump from a tower last week. States he has these thoughts intermittently and reports "I will do it next time."  Nursing note and vitals reviewed.   ED Course  Procedures (including critical care time)  Labs Review Labs Reviewed  CBC WITH DIFFERENTIAL/PLATELET - Abnormal; Notable for the following:    WBC 11.0 (*)    Neutro Abs 8.6 (*)    All other components within normal limits  ACETAMINOPHEN LEVEL - Abnormal; Notable for the following:    Acetaminophen (Tylenol), Serum <10 (*)    All other components within  normal limits  URINE RAPID DRUG SCREEN, HOSP PERFORMED - Abnormal; Notable for the following:    Cocaine POSITIVE (*)    All other components within normal limits  URINALYSIS, ROUTINE W REFLEX MICROSCOPIC (NOT AT [facility]) - Abnormal; Notable for the following:    Ketones, ur 15 (*)    All other components within normal limits  COMPREHENSIVE METABOLIC PANEL  ETHANOL  SALICYLATE LEVEL  TROPONIN I    Imaging Review Ct Head Wo Contrast  04/10/2015   CLINICAL DATA:  Dizziness since this morning.  Slurred speech.  EXAM: CT HEAD WITHOUT CONTRAST  TECHNIQUE: Contiguous axial images were obtained from the base of the skull through the vertex without contrast.  COMPARISON:  08/17/2012 and 05/30/2012  FINDINGS: Surgical clips in the left middle cranial fossa region compatible with previous aneurysm clipping. Patient has had a left craniotomy. Scattered areas of low-density in the periventricular white matter. No evidence for acute hemorrhage, mass lesion, midline shift, hydrocephalus or large infarct. Cannot exclude small old lacune infarcts in the right basal ganglia and thalami. Mild mucosal  thickening in the ethmoid air cells. No evidence for acute bone abnormality.  IMPRESSION: No acute intracranial abnormality.  Postsurgical changes compatible with aneurysm clipping.  Scattered areas of white matter disease may represent chronic small vessel ischemic disease.   Electronically Signed   By: Richarda Overlie M.D.   On: 04/10/2015 14:01     I have personally reviewed and evaluated these images and lab results as part of my medical decision-making.   EKG Interpretation   Date/Time:  Saturday April 10 2015 11:50:41 EDT Ventricular Rate:  85 PR Interval:  171 QRS Duration: 80 QT Interval:  364 QTC Calculation: 433 R Axis:   61 Text Interpretation:  Sinus rhythm Nonspecific T abnrm, anterolateral  leads Borderline ST elevation, anterior leads No significant change since  last tracing Confirmed by  Anitra Lauth  MD, Alphonzo Lemmings ([id number]) on 04/10/2015  12:01:50 PM      MDM   Final diagnoses:  Slurred speech  Substance induced mood disorder  Cocaine dependence with intoxication and without complication   64 year old male presents with dizziness, also reports chest pain and thoughts of wanting to jump off of a tower last week. Reports auditory hallucinations. Currently, denies SI or HI. [facility], as patient was recently seen there; [facility] was unable to provide any additional information. Recently admitted for chest pain work-up, which was unremarkable. Echo 09/14 with EF 60-65%, no wall motion abnormality.  Patient afebrile with normal vitals. Speech slightly slurred, otherwise normal neuro exam with no focal deficits. Patient is somewhat drowsy, but oriented x 4. Heart RRR. Lungs with mild end expiratory wheezing to lower lobes bilaterally. No respiratory distress. Abdomen soft, non-tender, non-distended.  CBC remarkable for mild leukocytosis with WBC 11. CMP within normal limits. Ethanol, salicylate, tylenol negative. EKG no acute ischemia, troponin negative x 1. UA with ketones. UDS positive for cocaine. CT head negative for acute intracranial abnormality.  TTS consulted, who recommended patient be observed overnight and re-evaluated in the morning.  BP 116/97 mmHg  Pulse 102  Temp(Src) 98 F (36.7 C) (Oral)  Resp 16  SpO2 99%     Mady Gemma, PA-C 04/11/15 1749  Gwyneth Sprout, MD 04/11/15 2213

## 2015-04-10 NOTE — ED Notes (Signed)
Pt. Noted sleeping in room. No complaints or concerns voiced. No distress or abnormal behavior noted. Will continue to monitor with security cameras. Q 15 minute rounds continue. 

## 2015-04-10 NOTE — ED Notes (Signed)
AC made aware that pt needs sitter.

## 2015-04-11 DIAGNOSIS — F1994 Other psychoactive substance use, unspecified with psychoactive substance-induced mood disorder: Secondary | ICD-10-CM

## 2015-04-11 NOTE — ED Notes (Signed)
Pt. Noted sleeping in room. No complaints or concerns voiced. No distress or abnormal behavior noted. Will continue to monitor with security cameras. Q 15 minute rounds continue. 
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
## 2015-04-11 NOTE — ED Notes (Signed)
Up to the bathroom 

## 2015-04-11 NOTE — ED Notes (Addendum)
Written dc instructions reviewed w/ pt. Pt encouraged to take medications as directed and follow up tomorrow with [facility] as planned.  Bus pass given.  Pt ambulatory w/o difficulty to dc window w/ mHt, belongings returned after leaving the unit.

## 2015-04-11 NOTE — [facility] Suicide Risk Assessment (Signed)
Suicide Risk Assessment  Discharge Assessment   [facility] Discharge Suicide Risk Assessment   Demographic Factors:  Male  Total Time spent with patient: 45 minutes Musculoskeletal: Strength & Muscle Tone: within normal limits Gait & Station: normal Patient leans: N/A  Psychiatric Specialty Exam: Physical Exam  Review of Systems  Constitutional: Negative.   HENT: Negative.   Eyes: Negative.   Respiratory: Negative.   Cardiovascular: Negative.   Gastrointestinal: Negative.   Genitourinary: Negative.   Musculoskeletal: Negative.   Skin: Negative.   Neurological: Negative.   Endo/Heme/Allergies: Negative.   Psychiatric/Behavioral: Positive for substance abuse.    Blood pressure 130/83, pulse 77, temperature 97.8 F (36.6 C), temperature source Oral, resp. rate 18, SpO2 99 %.There is no weight on file to calculate BMI.  General Appearance: Casual  Eye Contact::  Good  Speech:  Normal Rate  Volume:  Normal  Mood:  Euthymic  Affect:  Congruent  Thought Process:  Coherent  Orientation:  Full (Time, Place, and Person)  Thought Content:  WDL  Suicidal Thoughts:  No  Homicidal Thoughts:  No  Memory:  Immediate;   Good Recent;   Good Remote;   Good  Judgement:  Fair  Insight:  Fair  Psychomotor Activity:  Normal  Concentration:  Good  Recall:  Good  Fund of Knowledge:Good  Language: Good  Akathisia:  No  Handed:  Right  AIMS (if indicated):     Assets:  Leisure Time Physical Health Resilience  ADL's:  Intact  Cognition: WNL  Sleep:           Has this patient used any form of tobacco in the last 30 days? (Cigarettes, Smokeless Tobacco, Cigars, and/or Pipes) Yes, A prescription for an FDA-approved tobacco cessation medication was offered at discharge and the patient refused  Mental Status Per Nursing Assessment::   On Admission:   Cocaine abuse, homicidal ideations  Current Mental Status by Physician: NA  Loss Factors: NA  Historical Factors: NA  Risk  Reduction Factors:   Positive social support and Positive therapeutic relationship  Continued Clinical Symptoms:  None  Cognitive Features That Contribute To Risk:  None    Suicide Risk:  Minimal: No identifiable suicidal ideation.  Patients presenting with no risk factors but with morbid ruminations; may be classified as minimal risk based on the severity of the depressive symptoms  Principal Problem: Substance induced mood disorder Discharge Diagnoses:  Patient Active Problem List   Diagnosis Date Noted  . Substance induced mood disorder [F19.94] 03/02/2015    Priority: High  . Cocaine dependence [F14.20] 03/02/2015    Priority: High  . Suicidal ideation [R45.851] 03/31/2015  . Schizophrenia [F20.9] 03/31/2015  . Chest pain [R07.9] 03/30/2015  . Hypertension [I10] 03/30/2015  . COPD (chronic obstructive pulmonary disease) [J44.9] 03/30/2015  . Alcohol dependence [F10.20] 03/02/2015      Plan Of Care/Follow-up recommendations:  Activity:  as tolerated Diet:  hearth healthy diet  Is patient on multiple antipsychotic therapies at discharge:  No   Has Patient had three or more failed trials of antipsychotic monotherapy by history:  No  Recommended Plan for Multiple Antipsychotic Therapies: NA    LORD, JAMISON, PMH-NP 04/11/2015, 11:19 AM

## 2015-04-11 NOTE — ED Notes (Addendum)
Dr Mervyn Skeeters and Catha Nottingham DNP into see.Pt denies si/hi/avh at this time

## 2015-04-14 ENCOUNTER — Ambulatory Visit: Payer: Self-pay | Admitting: Family Medicine

## 2015-04-16 ENCOUNTER — Encounter (HOSPITAL_COMMUNITY): Payer: Self-pay | Admitting: *Deleted

## 2015-04-16 ENCOUNTER — Emergency Department (HOSPITAL_COMMUNITY): Payer: Medicare Other

## 2015-04-16 ENCOUNTER — Emergency Department (HOSPITAL_COMMUNITY)
Admission: EM | Admit: 2015-04-16 | Discharge: 2015-04-16 | Disposition: A | Discharge status: Home / Self Care | Payer: Medicare Other | Attending: Emergency Medicine | Admitting: Emergency Medicine

## 2015-04-16 DIAGNOSIS — Z720 Tobacco use: Secondary | ICD-10-CM | POA: Insufficient documentation

## 2015-04-16 DIAGNOSIS — J449 Chronic obstructive pulmonary disease, unspecified: Secondary | ICD-10-CM | POA: Insufficient documentation

## 2015-04-16 DIAGNOSIS — R531 Weakness: Secondary | ICD-10-CM | POA: Diagnosis not present

## 2015-04-16 DIAGNOSIS — R5381 Other malaise: Secondary | ICD-10-CM | POA: Diagnosis not present

## 2015-04-16 DIAGNOSIS — Z950 Presence of cardiac pacemaker: Secondary | ICD-10-CM | POA: Diagnosis not present

## 2015-04-16 DIAGNOSIS — I10 Essential (primary) hypertension: Secondary | ICD-10-CM | POA: Diagnosis not present

## 2015-04-16 DIAGNOSIS — R269 Unspecified abnormalities of gait and mobility: Secondary | ICD-10-CM | POA: Insufficient documentation

## 2015-04-16 DIAGNOSIS — R4701 Aphasia: Secondary | ICD-10-CM | POA: Diagnosis present

## 2015-04-16 LAB — I-STAT CHEM 8, ED
BUN: 14 mg/dL (ref 6–20)
CREATININE: 1 mg/dL (ref 0.61–1.24)
Calcium, Ion: 1.18 mmol/L (ref 1.13–1.30)
Chloride: 105 mmol/L (ref 101–111)
Glucose, Bld: 154 mg/dL — ABNORMAL HIGH (ref 65–99)
HEMATOCRIT: 39 % (ref 39.0–52.0)
HEMOGLOBIN: 13.3 g/dL (ref 13.0–17.0)
POTASSIUM: 3.4 mmol/L — AB (ref 3.5–5.1)
Sodium: 142 mmol/L (ref 135–145)
TCO2: 24 mmol/L (ref 0–100)

## 2015-04-16 LAB — CBC WITH DIFFERENTIAL/PLATELET
BASOS ABS: 0 10*3/uL (ref 0.0–0.1)
Basophils Relative: 0 %
EOS ABS: 0.3 10*3/uL (ref 0.0–0.7)
EOS PCT: 5 %
HCT: 37.6 % — ABNORMAL LOW (ref 39.0–52.0)
Hemoglobin: 12.5 g/dL — ABNORMAL LOW (ref 13.0–17.0)
LYMPHS PCT: 35 %
Lymphs Abs: 2.3 10*3/uL (ref 0.7–4.0)
MCH: 31.4 pg (ref 26.0–34.0)
MCHC: 33.2 g/dL (ref 30.0–36.0)
MCV: 94.5 fL (ref 78.0–100.0)
MONO ABS: 0.4 10*3/uL (ref 0.1–1.0)
Monocytes Relative: 6 %
Neutro Abs: 3.5 10*3/uL (ref 1.7–7.7)
Neutrophils Relative %: 54 %
PLATELETS: 257 10*3/uL (ref 150–400)
RBC: 3.98 MIL/uL — ABNORMAL LOW (ref 4.22–5.81)
RDW: 13.5 % (ref 11.5–15.5)
WBC: 6.4 10*3/uL (ref 4.0–10.5)

## 2015-04-16 LAB — URINALYSIS, ROUTINE W REFLEX MICROSCOPIC
BILIRUBIN URINE: NEGATIVE
GLUCOSE, UA: NEGATIVE mg/dL
Hgb urine dipstick: NEGATIVE
KETONES UR: NEGATIVE mg/dL
Leukocytes, UA: NEGATIVE
NITRITE: NEGATIVE
PH: 5.5 (ref 5.0–8.0)
PROTEIN: NEGATIVE mg/dL
Specific Gravity, Urine: 1.006 (ref 1.005–1.030)
Urobilinogen, UA: 0.2 mg/dL (ref 0.0–1.0)

## 2015-04-16 LAB — ETHANOL: Alcohol, Ethyl (B): <5 mg/dL (ref <5)

## 2015-04-16 LAB — RAPID URINE DRUG SCREEN, HOSP PERFORMED
Amphetamines: NOT DETECTED
BARBITURATES: NOT DETECTED
Benzodiazepines: NOT DETECTED
Cocaine: NOT DETECTED
Opiates: NOT DETECTED
Tetrahydrocannabinol: NOT DETECTED

## 2015-04-16 LAB — I-STAT TROPONIN, ED: TROPONIN I, POC: 0.01 ng/mL (ref 0.00–0.08)

## 2015-04-16 MED ORDER — AMMONIA AROMATIC IN INHA
RESPIRATORY_TRACT | Status: AC
  Filled 2015-04-16: qty 20

## 2015-04-16 NOTE — ED Provider Notes (Signed)
CSN: [id number]     Arrival date & time 04/16/15  1247 History   First MD Initiated Contact with Patient 04/16/15 1255     Chief Complaint  Patient presents with  . Aphasia     (Consider location/radiation/quality/duration/timing/severity/associated sxs/prior Treatment) HPI   Ryan Reid is a 64 y.o. maleWho presents for evaluation, by EMS for surgery speech, drooling and difficulty walking because of right leg weakness. Patient is unable to give additional history.he was in the emergency room 6 days ago with vague complaints,observed overnight with consultation by TTS.  Level V caveat- mental status   Past Medical History  Diagnosis Date  . Homeless   . Hypertension   . H/O suicide attempt     reports stabbed self  . H/O suicide attempt     reports jumped from tower  . COPD (chronic obstructive pulmonary disease)    History reviewed. No pertinent past surgical history. History reviewed. No pertinent family history. Social History  Substance Use Topics  . Smoking status: Current Every Day Smoker -- 2.00 packs/day  . Smokeless tobacco: None  . Alcohol Use: Yes     Comment: 1 beer a day    Review of Systems  Unable to perform ROS     Allergies  Lactose intolerance (gi)  Home Medications   Prior to Admission medications   Medication Sig Start Date End Date Taking? Authorizing Provider  amLODipine (NORVASC) 10 MG tablet Take 1 tablet (10 mg total) by mouth daily. Patient not taking: Reported on 04/10/2015 04/01/15   Maretta Bees, MD  aspirin EC 81 MG tablet Take 1 tablet (81 mg total) by mouth daily. Patient not taking: Reported on 04/10/2015 04/01/15   Maretta Bees, MD  hydrOXYzine (ATARAX/VISTARIL) 25 MG tablet Take 1 tablet (25 mg total) by mouth every 6 (six) hours as needed for anxiety. Patient not taking: Reported on 04/10/2015 03/08/15   Sanjuana Kava, NP  nicotine (NICODERM CQ - DOSED IN MG/24 HOURS) 21 mg/24hr patch Place 1 patch (21 mg total)  onto the skin daily. For smoking cessation Patient not taking: Reported on 04/10/2015 04/01/15   Maretta Bees, MD  pantoprazole (PROTONIX) 40 MG tablet Take 1 tablet (40 mg total) by mouth daily. Switch for any other PPI at similar dose and frequency Patient not taking: Reported on 04/10/2015 04/01/15   Maretta Bees, MD  traZODone (DESYREL) 100 MG tablet Take 1 tablet (100 mg total) by mouth at bedtime and may repeat dose one time if needed. For sleep Patient not taking: Reported on 04/10/2015 04/01/15   Maretta Bees, MD   BP 113/69 mmHg  Pulse 65  Resp 12  SpO2 99% Physical Exam  Constitutional: He appears well-developed. He appears distressed.  He appears under nourished.  HENT:  Head: Normocephalic and atraumatic.  Right Ear: External ear normal.  Left Ear: External ear normal.  Eyes: Conjunctivae and EOM are normal. Pupils are equal, round, and reactive to light.  Neck: Normal range of motion and phonation normal. Neck supple.  Cardiovascular: Normal rate, regular rhythm and normal heart sounds.   Pulmonary/Chest: Effort normal and breath sounds normal. He exhibits no bony tenderness.  Abdominal: Soft. There is no tenderness.  Musculoskeletal: Normal range of motion.  Neurological: He is alert. No cranial nerve deficit or sensory deficit. He exhibits normal muscle tone. Coordination normal.  Garbled speech, no focal abnormality. He is able to follow commands. Strength 5 over 5 bilaterally. No facial asymmetry.  Skin:  Skin is warm, dry and intact.  Psychiatric:  He appears depressed  Nursing note and vitals reviewed.   ED Course  Procedures (including critical care time)  Medications - No data to display  Patient Vitals for the past 24 hrs:  BP Pulse Resp SpO2  04/16/15 1500 113/69 mmHg 65 12 99 %  04/16/15 1445 109/65 mmHg 67 13 98 %  04/16/15 1430 113/64 mmHg 68 13 98 %  04/16/15 1415 109/63 mmHg 68 12 99 %  04/16/15 1400 124/76 mmHg 74 14 100 %  04/16/15  1345 113/66 mmHg 71 11 98 %  04/16/15 1330 115/62 mmHg 71 12 98 %  04/16/15 1315 121/61 mmHg 73 13 97 %  04/16/15 1305 127/74 mmHg 85 17 98 %  04/16/15 1247 - - - 98 %    3:40 PM Reevaluation with update and discussion. After initial assessment and treatment, an updated evaluation reveals patient is off cardiac monitor now, and is unresponsive. He responds to sternal rub, and facial pressure, by grimacing and groaning. Will recheck CBC, and reassess. WENTZ,ELLIOTT L   16:00- patient now more alert and has resumed his prior neurologic status, but remains lethargic. He stated he wanted to try to drink something. He denied being sad.  6:08 PM Reevaluation with update and discussion. After initial assessment and treatment, an updated evaluation reveals he continues to be more alert and cooperative. He is talking to social work, at this time, who is going to help him find a place to go tonight. WENTZ,ELLIOTT L   Labs Review Labs Reviewed  CBC WITH DIFFERENTIAL/PLATELET - Abnormal; Notable for the following:    RBC 3.98 (*)    Hemoglobin 12.5 (*)    HCT 37.6 (*)    All other components within normal limits  I-STAT CHEM 8, ED - Abnormal; Notable for the following:    Potassium 3.4 (*)    Glucose, Bld 154 (*)    All other components within normal limits  ETHANOL  URINALYSIS, ROUTINE W REFLEX MICROSCOPIC (NOT AT [facility])  URINE RAPID DRUG SCREEN, HOSP PERFORMED  I-STAT TROPOININ, ED    Imaging Review No results found. I have personally reviewed and evaluated these images and lab results as part of my medical decision-making.   EKG Interpretation   Date/Time:  Friday April 16 2015 13:00:07 EDT Ventricular Rate:  70 PR Interval:  169 QRS Duration: 76 QT Interval:  400 QTC Calculation: 432 R Axis:   72 Text Interpretation:  Sinus rhythm Consider left ventricular hypertrophy  Nonspecific T abnormalities, lateral leads since last tracing no  significant change Confirmed by Effie Shy  MD,  Mechele Collin ([id number]) on 04/16/2015  1:04:08 PM      MDM   Final diagnoses:  Malaise    Nursing Notes Reviewed/ Care Coordinated, and agree without changes. Applicable Imaging Reviewed.  Interpretation of Laboratory Data incorporated into ED treatment   Plan: Social work evaluation for help with finding a place to stay, and arranging ongoing care in the community.  Nursing Notes Reviewed/ Care Coordinated Applicable Imaging Reviewed Interpretation of Laboratory Data incorporated into ED treatment  The patient appears reasonably screened and/or stabilized for discharge and I doubt any other medical condition or other [facility] requiring further screening, evaluation, or treatment in the ED at this time prior to discharge.  Plan: Home Medications- none; Home Treatments- rest; return here if the recommended treatment, does not improve the symptoms; Recommended follow up- PCP prn     Mancel Bale, MD 04/19/15 0730

## 2015-04-16 NOTE — ED Notes (Signed)
Pt is alert and walking around outside of his room. Pt states, "You gonna get this shit outta my arm", referring to his IV. Before this moment pt has been only arousable to painful stimuli and wouldn't talk very clearly. The pt is ambulating without difficulty and speaks clearly. Pt to be d/c.

## 2015-04-16 NOTE — ED Notes (Signed)
Pt arriving from homeless shelter via [facility]. Pt wasn't seen by anyone for several days and upon returning to shelter pt is dragging his right foot/leg and is having slurred speech and excessive drooling. [facility] states pt has been lethargic, but is arousable is minor stimulation.

## 2015-04-16 NOTE — ED Notes (Signed)
Pt is in stable condition upon d/c and is escorted from ED via wheelchair. 

## 2015-04-16 NOTE — Progress Notes (Signed)
CSW unable to get any information from pt.  He would not open his eyes and his speech was inaudible and garbled/slow.  CSW called Mr. Nathaniel Man who stated that he was a friend of pt, but had not seen him recently and could not provide any information about pt/other family members.  CSW also called [employer] who confirmed that pt stayed there over 6 months ago, but he has not been there recently.   [employer] checked their records and could not find a contact for pt.  CSW will attempt to speak with pt when he is more awake.

## 2015-04-16 NOTE — Discharge Instructions (Signed)
Go to the shelter tonight   Fatigue Fatigue is a feeling of tiredness, lack of energy, lack of motivation, or feeling tired all the time. Having enough rest, good nutrition, and reducing stress will normally reduce fatigue. Consult your caregiver if it persists. The nature of your fatigue will help your caregiver to find out its cause. The treatment is based on the cause.  CAUSES  There are many causes for fatigue. Most of the time, fatigue can be traced to one or more of your habits or routines. Most causes fit into one or more of three general areas. They are: Lifestyle problems  Sleep disturbances.  Overwork.  Physical exertion.  Unhealthy habits.  Poor eating habits or eating disorders.  Alcohol and/or drug use .  Lack of proper nutrition (malnutrition). Psychological problems  Stress and/or anxiety problems.  Depression.  Grief.  Boredom. Medical Problems or Conditions  Anemia.  Pregnancy.  Thyroid gland problems.  Recovery from major surgery.  Continuous pain.  Emphysema or asthma that is not well controlled  Allergic conditions.  Diabetes.  Infections (such as mononucleosis).  Obesity.  Sleep disorders, such as sleep apnea.  Heart failure or other heart-related problems.  Cancer.  Kidney disease.  Liver disease.  Effects of certain medicines such as antihistamines, cough and cold remedies, prescription pain medicines, heart and blood pressure medicines, drugs used for treatment of cancer, and some antidepressants. SYMPTOMS  The symptoms of fatigue include:   Lack of energy.  Lack of drive (motivation).  Drowsiness.  Feeling of indifference to the surroundings. DIAGNOSIS  The details of how you feel help guide your caregiver in finding out what is causing the fatigue. You will be asked about your present and past health condition. It is important to review all medicines that you take, including prescription and non-prescription items. A  thorough exam will be done. You will be questioned about your feelings, habits, and normal lifestyle. Your caregiver may suggest blood tests, urine tests, or other tests to look for common medical causes of fatigue.  TREATMENT  Fatigue is treated by correcting the underlying cause. For example, if you have continuous pain or depression, treating these causes will improve how you feel. Similarly, adjusting the dose of certain medicines will help in reducing fatigue.  HOME CARE INSTRUCTIONS   Try to get the required amount of good sleep every night.  Eat a healthy and nutritious diet, and drink enough water throughout the day.  Practice ways of relaxing (including yoga or meditation).  Exercise regularly.  Make plans to change situations that cause stress. Act on those plans so that stresses decrease over time. Keep your work and personal routine reasonable.  Avoid street drugs and minimize use of alcohol.  Start taking a daily multivitamin after consulting your caregiver. SEEK MEDICAL CARE IF:   You have persistent tiredness, which cannot be accounted for.  You have fever.  You have unintentional weight loss.  You have headaches.  You have disturbed sleep throughout the night.  You are feeling sad.  You have constipation.  You have dry skin.  You have gained weight.  You are taking any new or different medicines that you suspect are causing fatigue.  You are unable to sleep at night.  You develop any unusual swelling of your legs or other parts of your body. SEEK IMMEDIATE MEDICAL CARE IF:   You are feeling confused.  Your vision is blurred.  You feel faint or pass out.  You develop severe headache.  You develop severe abdominal, pelvic, or back pain.  You develop chest pain, shortness of breath, or an irregular or fast heartbeat.  You are unable to pass a normal amount of urine.  You develop abnormal bleeding such as bleeding from the rectum or you vomit  blood.  You have thoughts about harming yourself or committing suicide.  You are worried that you might harm someone else. MAKE SURE YOU:   Understand these instructions.  Will watch your condition.  Will get help right away if you are not doing well or get worse. Document Released: 04/30/2007 Document Revised: 09/25/2011 Document Reviewed: 11/04/2013 [facility] Patient Information 2015 [place], [place]. This information is not intended to replace advice given to you by your health care provider. Make sure you discuss any questions you have with your health care provider.

## 2015-04-16 NOTE — ED Notes (Signed)
Patient transported to CT 

## 2015-04-17 LAB — CBG MONITORING, ED: Glucose-Capillary: 107 mg/dL — ABNORMAL HIGH (ref 65–99)

## 2017-06-10 IMAGING — CT CT HEAD W/O CM
1 of 2 series · 13 of 30 positions shown, 17 images · non-contrast
Comparison: April 10, 2015

CLINICAL DATA: The patient is homeless. Patient is seen to have
slurred speech and dry ending his right foot and leg.

EXAM:
CT HEAD WITHOUT CONTRAST
TECHNIQUE: Contiguous axial images were obtained from the base of the skull
through the vertex without intravenous contrast.

[Series 2: head 5.0 h30s · axial · 0.44mm/px · z∈[-113,+12]mm · 13 of 31 slices shown, 17 images]
[im 3/31  brain]
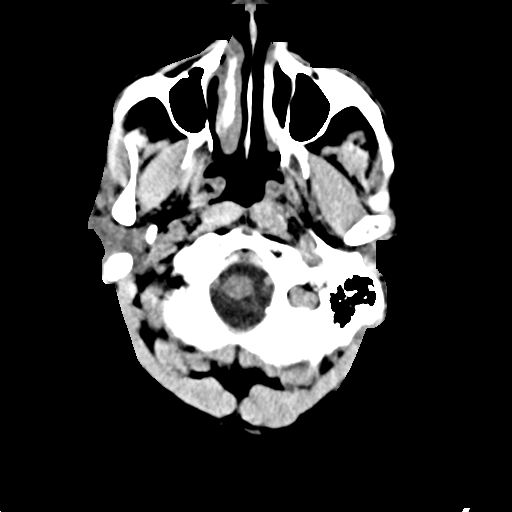
[im 3/31  bone]
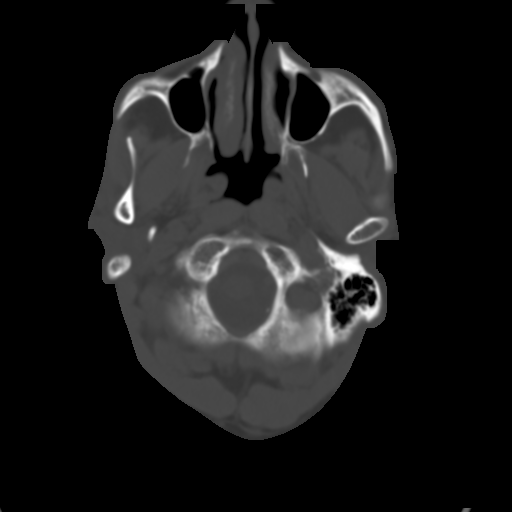
[im 5/31  brain]
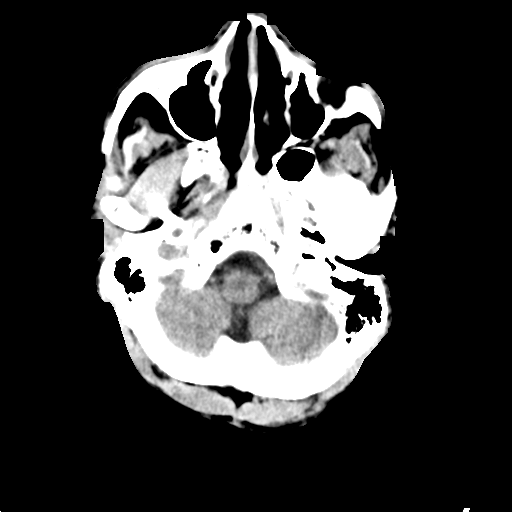
[im 7/31  brain]
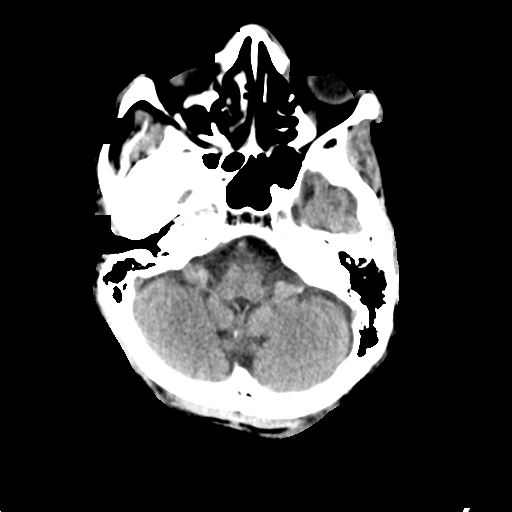
[im 9/31  brain]
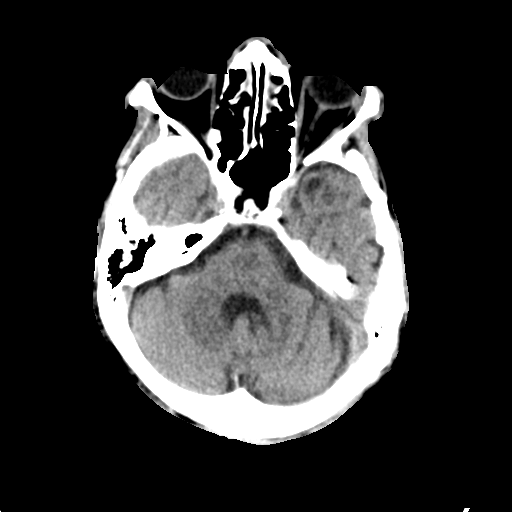
[im 11/31  brain]
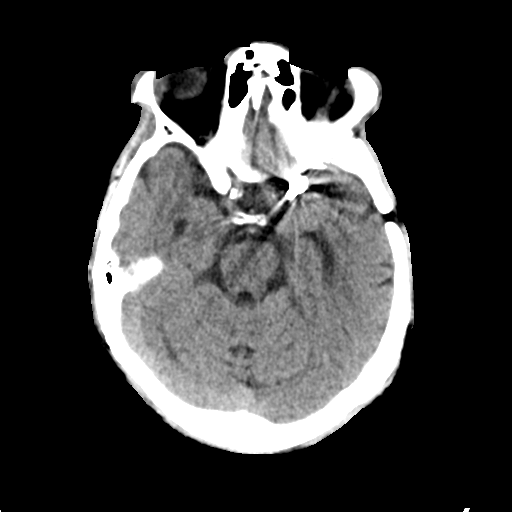
[im 11/31  bone]
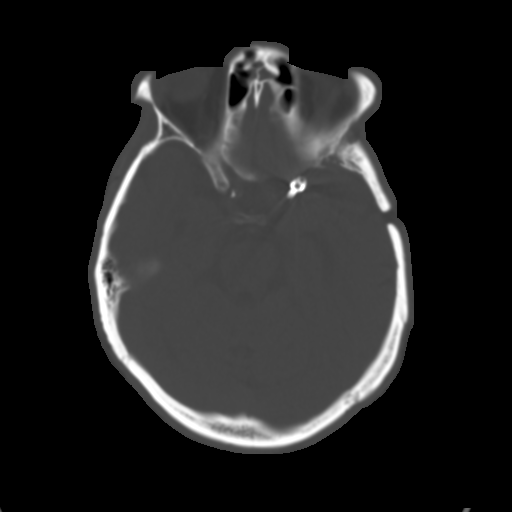
[im 13/31  brain]
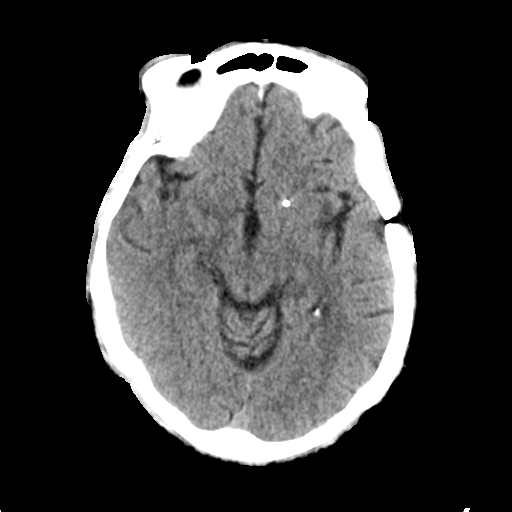
[im 16/31  brain]
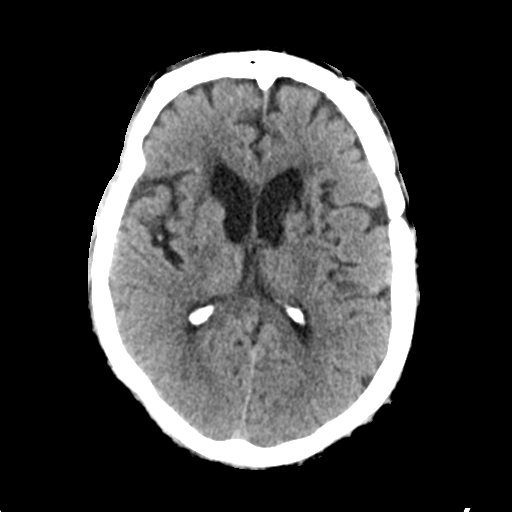
[im 18/31  brain]
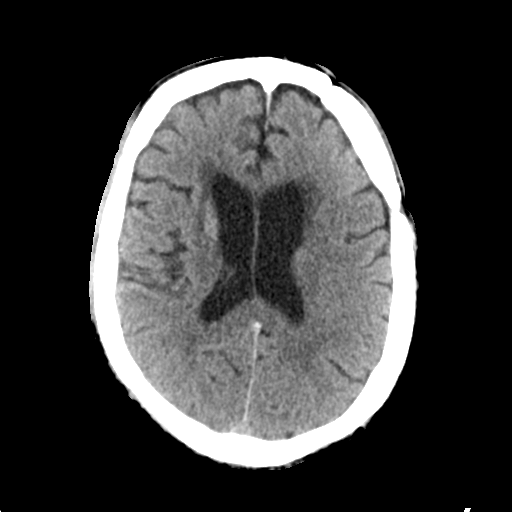
[im 20/31  brain]
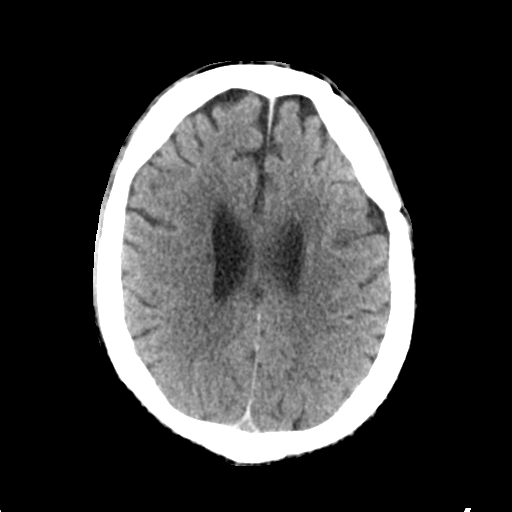
[im 20/31  bone]
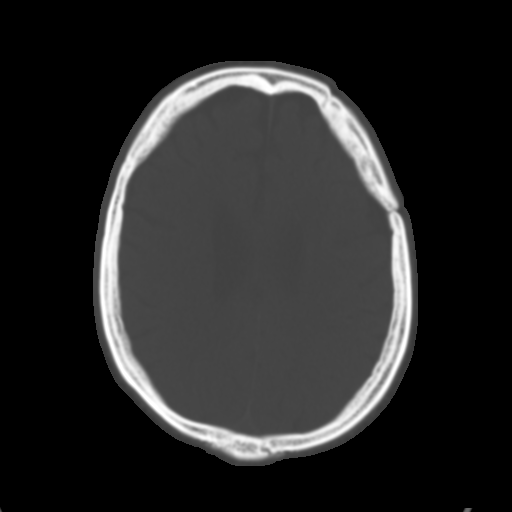
[im 22/31  brain]
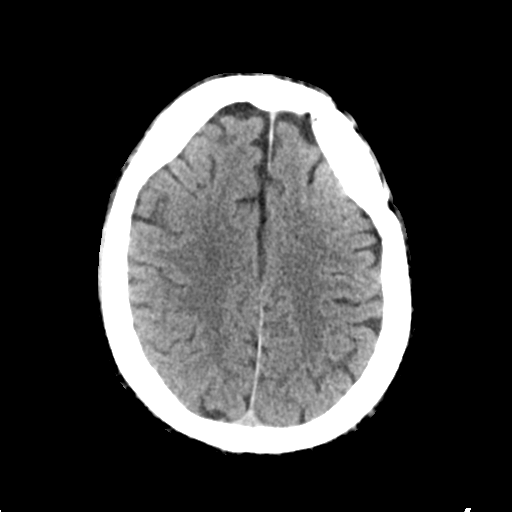
[im 24/31  brain]
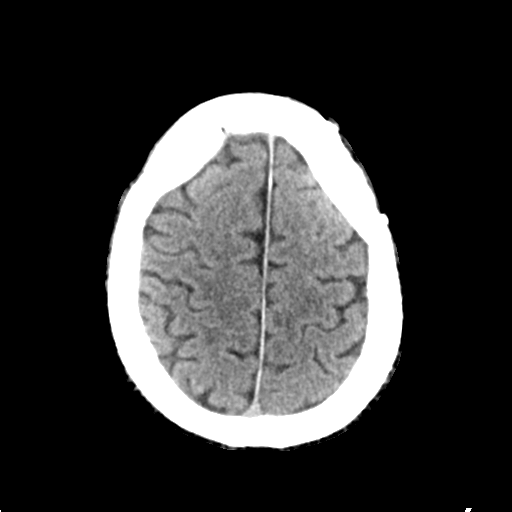
[im 26/31  brain]
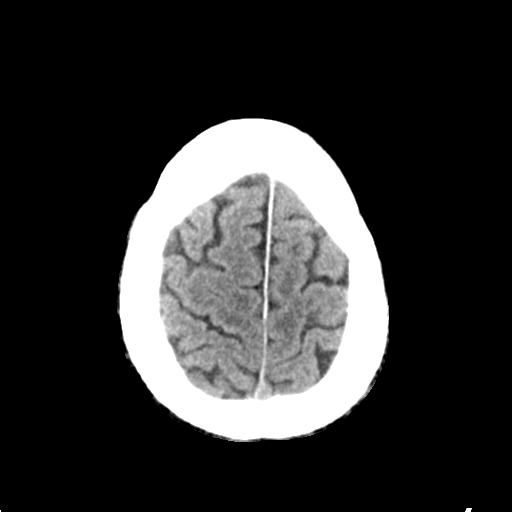
[im 28/31  brain]
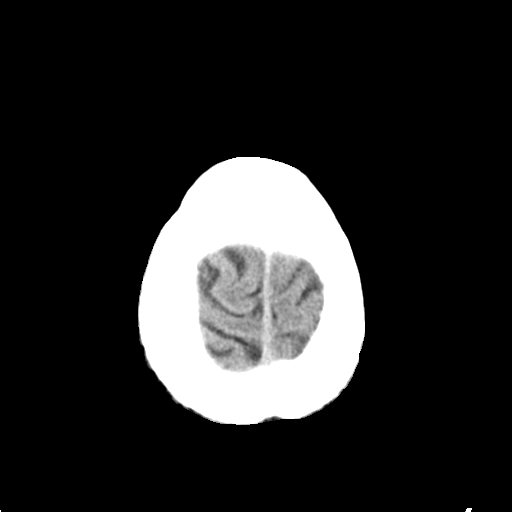
[im 28/31  bone]
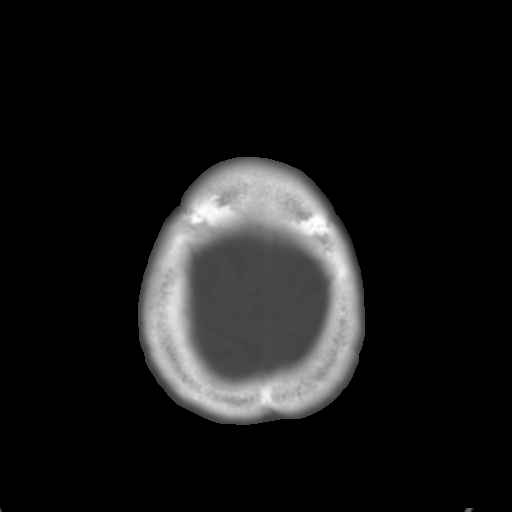

[13 of 30 positions shown; findings below may reference images not displayed]

FINDINGS: There is no midline shift, hydrocephalus, or mass. No acute
hemorrhage or acute transcortical infarct is identified. The
surgical clips are identified in the left middle cranial fossa could
not pad oval with previous aneurysm clipping. Patient's is status
post prior left craniotomy. There are small old lacune infarctions
in bilateral basal ganglia. Mucoperiosteal thickening of bilateral
ethmoid sinuses are noted.
IMPRESSION: No focal acute intracranial abnormality identified.

Prior postoperative changes compatible with aneurysmal clipping.

## 2019-12-27 ENCOUNTER — Ambulatory Visit: Payer: Medicare Other | Attending: Internal Medicine

## 2019-12-27 DIAGNOSIS — Z23 Encounter for immunization: Secondary | ICD-10-CM

## 2019-12-27 NOTE — Progress Notes (Signed)
   Covid-19 Vaccination Clinic  Name:  Ryan Reid    MRN: [id number] DOB: 1950-08-01  12/27/2019  Ryan Reid was observed post Covid-19 immunization for 15 minutes without incident. He was provided with Vaccine Information Sheet and instruction to access the V-Safe system.   Ryan Reid was instructed to call 911 with any severe reactions post vaccine: Marland Kitchen Difficulty breathing  . Swelling of face and throat  . A fast heartbeat  . A bad rash all over body  . Dizziness and weakness   Immunizations Administered    Name Date Dose VIS Date Route   Moderna COVID-19 Vaccine 12/27/2019 10:24 AM 0.5 mL 06/2019 Intramuscular   Manufacturer: Gala Murdoch   Lot: [id number]   NDC: 15176-160-73

## 2020-01-24 ENCOUNTER — Ambulatory Visit: Payer: Medicare Other | Attending: Internal Medicine

## 2020-01-24 DIAGNOSIS — Z23 Encounter for immunization: Secondary | ICD-10-CM

## 2020-01-24 NOTE — Progress Notes (Signed)
   Covid-19 Vaccination Clinic  Name:  Ryan Reid    MRN: [id number] DOB: 07-22-1950  01/24/2020  Mr. Ryan Reid was observed post Covid-19 immunization for 15 minutes without incident. He was provided with Vaccine Information Sheet and instruction to access the V-Safe system.   Mr. Ryan Reid was instructed to call 911 with any severe reactions post vaccine: Marland Kitchen Difficulty breathing  . Swelling of face and throat  . A fast heartbeat  . A bad rash all over body  . Dizziness and weakness   Immunizations Administered    Name Date Dose VIS Date Route   Moderna COVID-19 Vaccine 01/24/2020 10:00 AM 0.5 mL 06/2019 Intramuscular   Manufacturer: Gala Murdoch   Lot: [id number]   NDC: 85885-027-74
